# Patient Record
Sex: Female | Born: 1935 | Race: White | Hispanic: No | Smoking: Never smoker
Health system: Southern US, Community
[De-identification: ages and names within clinical notes are randomized; demographics above are authoritative.]

## PROBLEM LIST (undated history)

## (undated) DIAGNOSIS — K279 Peptic ulcer, site unspecified, unspecified as acute or chronic, without hemorrhage or perforation: Secondary | ICD-10-CM

## (undated) DIAGNOSIS — M1990 Unspecified osteoarthritis, unspecified site: Secondary | ICD-10-CM

## (undated) DIAGNOSIS — C50912 Malignant neoplasm of unspecified site of left female breast: Secondary | ICD-10-CM

## (undated) DIAGNOSIS — E785 Hyperlipidemia, unspecified: Secondary | ICD-10-CM

## (undated) DIAGNOSIS — I4891 Unspecified atrial fibrillation: Secondary | ICD-10-CM

## (undated) DIAGNOSIS — M6289 Other specified disorders of muscle: Secondary | ICD-10-CM

## (undated) DIAGNOSIS — Z9289 Personal history of other medical treatment: Secondary | ICD-10-CM

## (undated) DIAGNOSIS — D649 Anemia, unspecified: Secondary | ICD-10-CM

## (undated) DIAGNOSIS — I10 Essential (primary) hypertension: Secondary | ICD-10-CM

## (undated) DIAGNOSIS — N289 Disorder of kidney and ureter, unspecified: Secondary | ICD-10-CM

## (undated) DIAGNOSIS — R011 Cardiac murmur, unspecified: Secondary | ICD-10-CM

## (undated) DIAGNOSIS — C801 Malignant (primary) neoplasm, unspecified: Secondary | ICD-10-CM

## (undated) DIAGNOSIS — E039 Hypothyroidism, unspecified: Secondary | ICD-10-CM

## (undated) HISTORY — DX: Other specified disorders of muscle: M62.89

## (undated) HISTORY — DX: Hypothyroidism, unspecified: E03.9

## (undated) HISTORY — DX: Unspecified atrial fibrillation: I48.91

## (undated) HISTORY — PX: APPENDECTOMY: SHX54

## (undated) HISTORY — DX: Personal history of other medical treatment: Z92.89

## (undated) HISTORY — DX: Peptic ulcer, site unspecified, unspecified as acute or chronic, without hemorrhage or perforation: K27.9

## (undated) HISTORY — DX: Cardiac murmur, unspecified: R01.1

## (undated) HISTORY — DX: Unspecified osteoarthritis, unspecified site: M19.90

## (undated) HISTORY — DX: Disorder of kidney and ureter, unspecified: N28.9

## (undated) HISTORY — PX: BREAST SURGERY: SHX581

## (undated) HISTORY — DX: Malignant (primary) neoplasm, unspecified: C80.1

## (undated) HISTORY — DX: Hyperlipidemia, unspecified: E78.5

## (undated) HISTORY — DX: Essential (primary) hypertension: I10

## (undated) HISTORY — DX: Malignant neoplasm of unspecified site of left female breast: C50.912

## (undated) HISTORY — DX: Anemia, unspecified: D64.9

## (undated) HISTORY — PX: UPPER GASTROINTESTINAL ENDOSCOPY: SHX188

---

## 1999-10-06 ENCOUNTER — Encounter: Admission: RE | Admit: 1999-10-06 | Discharge: 1999-10-06 | Payer: Self-pay | Admitting: Family Medicine

## 1999-10-06 ENCOUNTER — Encounter: Payer: Self-pay | Admitting: Family Medicine

## 2000-10-17 ENCOUNTER — Encounter: Admission: RE | Admit: 2000-10-17 | Discharge: 2000-10-17 | Payer: Self-pay | Admitting: Family Medicine

## 2000-10-17 ENCOUNTER — Encounter: Payer: Self-pay | Admitting: Family Medicine

## 2002-09-13 ENCOUNTER — Encounter: Payer: Self-pay | Admitting: Family Medicine

## 2002-09-13 ENCOUNTER — Encounter: Admission: RE | Admit: 2002-09-13 | Discharge: 2002-09-13 | Payer: Self-pay | Admitting: Family Medicine

## 2002-11-25 ENCOUNTER — Other Ambulatory Visit: Admission: RE | Admit: 2002-11-25 | Discharge: 2002-11-25 | Payer: Self-pay | Admitting: Family Medicine

## 2004-04-13 ENCOUNTER — Encounter: Admission: RE | Admit: 2004-04-13 | Discharge: 2004-04-13 | Payer: Self-pay | Admitting: Family Medicine

## 2004-12-17 ENCOUNTER — Ambulatory Visit: Payer: Self-pay | Admitting: Family Medicine

## 2005-01-09 ENCOUNTER — Emergency Department (HOSPITAL_COMMUNITY): Admission: EM | Admit: 2005-01-09 | Discharge: 2005-01-09 | Payer: Self-pay | Admitting: Family Medicine

## 2005-01-13 ENCOUNTER — Emergency Department (HOSPITAL_COMMUNITY): Admission: EM | Admit: 2005-01-13 | Discharge: 2005-01-13 | Payer: Self-pay | Admitting: Emergency Medicine

## 2005-01-27 ENCOUNTER — Ambulatory Visit: Payer: Self-pay | Admitting: Family Medicine

## 2005-04-24 ENCOUNTER — Emergency Department (HOSPITAL_COMMUNITY): Admission: EM | Admit: 2005-04-24 | Discharge: 2005-04-24 | Payer: Self-pay | Admitting: Emergency Medicine

## 2005-06-27 ENCOUNTER — Encounter: Payer: Self-pay | Admitting: Cardiology

## 2005-06-27 ENCOUNTER — Ambulatory Visit: Payer: Self-pay

## 2005-07-04 ENCOUNTER — Encounter: Admission: RE | Admit: 2005-07-04 | Discharge: 2005-07-04 | Payer: Self-pay | Admitting: Family Medicine

## 2005-09-24 ENCOUNTER — Encounter: Payer: Self-pay | Admitting: Family Medicine

## 2006-08-26 ENCOUNTER — Encounter: Payer: Self-pay | Admitting: Family Medicine

## 2006-08-26 ENCOUNTER — Emergency Department (HOSPITAL_COMMUNITY): Admission: EM | Admit: 2006-08-26 | Discharge: 2006-08-26 | Payer: Self-pay | Admitting: Emergency Medicine

## 2006-08-29 ENCOUNTER — Ambulatory Visit: Payer: Self-pay | Admitting: *Deleted

## 2006-09-05 ENCOUNTER — Ambulatory Visit: Payer: Self-pay | Admitting: Internal Medicine

## 2006-09-07 ENCOUNTER — Ambulatory Visit: Payer: Self-pay | Admitting: *Deleted

## 2006-09-07 ENCOUNTER — Ambulatory Visit: Payer: Self-pay

## 2006-09-07 ENCOUNTER — Encounter: Payer: Self-pay | Admitting: Internal Medicine

## 2006-09-07 LAB — CONVERTED CEMR LAB
Cholesterol: 191 mg/dL (ref 0–200)
Total CHOL/HDL Ratio: 3.4
Triglycerides: 95 mg/dL (ref 0–149)

## 2006-09-20 ENCOUNTER — Ambulatory Visit: Payer: Self-pay | Admitting: *Deleted

## 2006-09-27 ENCOUNTER — Encounter: Payer: Self-pay | Admitting: Internal Medicine

## 2006-09-27 ENCOUNTER — Ambulatory Visit: Payer: Self-pay | Admitting: Internal Medicine

## 2006-10-05 ENCOUNTER — Ambulatory Visit: Payer: Self-pay | Admitting: Internal Medicine

## 2006-10-05 ENCOUNTER — Encounter: Payer: Self-pay | Admitting: Internal Medicine

## 2006-11-02 ENCOUNTER — Ambulatory Visit: Payer: Self-pay | Admitting: *Deleted

## 2006-11-02 LAB — CONVERTED CEMR LAB
ALT: 24 units/L (ref 0–40)
AST: 34 units/L (ref 0–37)
Albumin: 4.2 g/dL (ref 3.5–5.2)
Bilirubin, Direct: 0.1 mg/dL (ref 0.0–0.3)
Calcium: 10.1 mg/dL (ref 8.4–10.5)
Chloride: 104 meq/L (ref 96–112)
GFR calc Af Amer: 51 mL/min
GFR calc non Af Amer: 42 mL/min
Glucose, Bld: 133 mg/dL — ABNORMAL HIGH (ref 70–99)
LDL Cholesterol: 76 mg/dL (ref 0–99)
Sodium: 138 meq/L (ref 135–145)
Total CHOL/HDL Ratio: 2.6
VLDL: 25 mg/dL (ref 0–40)

## 2006-11-06 ENCOUNTER — Ambulatory Visit: Payer: Self-pay | Admitting: Internal Medicine

## 2006-11-10 ENCOUNTER — Ambulatory Visit: Payer: Self-pay | Admitting: Cardiovascular Disease

## 2006-11-16 ENCOUNTER — Ambulatory Visit: Payer: Self-pay | Admitting: Internal Medicine

## 2006-11-16 LAB — CONVERTED CEMR LAB: INR: 4.7 — ABNORMAL HIGH (ref 0.9–2.0)

## 2006-11-23 ENCOUNTER — Ambulatory Visit: Payer: Self-pay | Admitting: Internal Medicine

## 2006-12-04 ENCOUNTER — Ambulatory Visit: Payer: Self-pay | Admitting: Cardiovascular Disease

## 2006-12-07 ENCOUNTER — Ambulatory Visit: Payer: Self-pay

## 2006-12-14 ENCOUNTER — Ambulatory Visit: Payer: Self-pay | Admitting: Cardiology

## 2006-12-28 ENCOUNTER — Ambulatory Visit: Payer: Self-pay | Admitting: Cardiology

## 2007-01-01 ENCOUNTER — Ambulatory Visit: Payer: Self-pay | Admitting: Internal Medicine

## 2007-01-26 ENCOUNTER — Ambulatory Visit: Payer: Self-pay | Admitting: Internal Medicine

## 2007-02-26 ENCOUNTER — Ambulatory Visit: Payer: Self-pay | Admitting: Cardiology

## 2007-03-29 ENCOUNTER — Ambulatory Visit: Payer: Self-pay | Admitting: Internal Medicine

## 2007-04-30 ENCOUNTER — Ambulatory Visit: Payer: Self-pay | Admitting: Internal Medicine

## 2007-05-15 ENCOUNTER — Ambulatory Visit: Payer: Self-pay | Admitting: Internal Medicine

## 2007-06-05 ENCOUNTER — Ambulatory Visit: Payer: Self-pay | Admitting: Family Medicine

## 2007-06-05 DIAGNOSIS — K279 Peptic ulcer, site unspecified, unspecified as acute or chronic, without hemorrhage or perforation: Secondary | ICD-10-CM | POA: Insufficient documentation

## 2007-06-05 DIAGNOSIS — I10 Essential (primary) hypertension: Secondary | ICD-10-CM | POA: Insufficient documentation

## 2007-06-05 DIAGNOSIS — M109 Gout, unspecified: Secondary | ICD-10-CM | POA: Insufficient documentation

## 2007-06-05 DIAGNOSIS — E039 Hypothyroidism, unspecified: Secondary | ICD-10-CM | POA: Insufficient documentation

## 2007-06-05 DIAGNOSIS — I4891 Unspecified atrial fibrillation: Secondary | ICD-10-CM | POA: Insufficient documentation

## 2007-06-05 DIAGNOSIS — K219 Gastro-esophageal reflux disease without esophagitis: Secondary | ICD-10-CM

## 2007-06-05 DIAGNOSIS — E785 Hyperlipidemia, unspecified: Secondary | ICD-10-CM

## 2007-06-05 DIAGNOSIS — D509 Iron deficiency anemia, unspecified: Secondary | ICD-10-CM | POA: Insufficient documentation

## 2007-06-05 DIAGNOSIS — M1990 Unspecified osteoarthritis, unspecified site: Secondary | ICD-10-CM | POA: Insufficient documentation

## 2007-06-05 LAB — CONVERTED CEMR LAB
INR: 2.5
Prothrombin Time: 19.2 s

## 2007-06-11 LAB — CONVERTED CEMR LAB
ALT: 31 units/L (ref 0–35)
AST: 41 units/L — ABNORMAL HIGH (ref 0–37)
Albumin: 3.9 g/dL (ref 3.5–5.2)
Alkaline Phosphatase: 88 units/L (ref 39–117)
BUN: 20 mg/dL (ref 6–23)
CO2: 27 meq/L (ref 19–32)
Chloride: 101 meq/L (ref 96–112)
Creatinine, Ser: 1.1 mg/dL (ref 0.4–1.2)
Total Bilirubin: 0.9 mg/dL (ref 0.3–1.2)
Total Protein: 7.4 g/dL (ref 6.0–8.3)
Triglycerides: 118 mg/dL (ref 0–149)
VLDL: 24 mg/dL (ref 0–40)

## 2007-06-21 ENCOUNTER — Ambulatory Visit: Payer: Self-pay | Admitting: Family Medicine

## 2007-06-21 DIAGNOSIS — E119 Type 2 diabetes mellitus without complications: Secondary | ICD-10-CM | POA: Insufficient documentation

## 2007-06-27 ENCOUNTER — Telehealth: Payer: Self-pay | Admitting: Family Medicine

## 2007-06-28 ENCOUNTER — Telehealth (INDEPENDENT_AMBULATORY_CARE_PROVIDER_SITE_OTHER): Payer: Self-pay | Admitting: *Deleted

## 2007-07-05 ENCOUNTER — Ambulatory Visit: Payer: Self-pay | Admitting: Family Medicine

## 2007-07-05 ENCOUNTER — Encounter: Payer: Self-pay | Admitting: Family Medicine

## 2007-07-05 LAB — CONVERTED CEMR LAB
Blood Glucose, Fasting: 0 mg/dL
INR: 2.4
Prothrombin Time: 18.9 s

## 2007-07-09 ENCOUNTER — Telehealth: Payer: Self-pay | Admitting: Family Medicine

## 2007-08-01 ENCOUNTER — Ambulatory Visit: Payer: Self-pay | Admitting: Family Medicine

## 2007-08-02 LAB — CONVERTED CEMR LAB: INR: 1.9

## 2007-08-15 ENCOUNTER — Ambulatory Visit: Payer: Self-pay | Admitting: Family Medicine

## 2007-08-15 LAB — CONVERTED CEMR LAB

## 2007-08-28 ENCOUNTER — Encounter: Admission: RE | Admit: 2007-08-28 | Discharge: 2007-08-28 | Payer: Self-pay | Admitting: Family Medicine

## 2007-09-03 DIAGNOSIS — M810 Age-related osteoporosis without current pathological fracture: Secondary | ICD-10-CM

## 2007-09-04 ENCOUNTER — Ambulatory Visit: Payer: Self-pay | Admitting: Family Medicine

## 2007-09-04 LAB — CONVERTED CEMR LAB
HDL goal, serum: 40 mg/dL
LDL Goal: 100 mg/dL

## 2007-09-06 LAB — CONVERTED CEMR LAB
BUN: 24 mg/dL — ABNORMAL HIGH (ref 6–23)
CO2: 26 meq/L (ref 19–32)
Calcium: 9.5 mg/dL (ref 8.4–10.5)
Eosinophils Relative: 4.2 % (ref 0.0–5.0)
Ferritin: 21.7 ng/mL (ref 10.0–291.0)
Glucose, Bld: 130 mg/dL — ABNORMAL HIGH (ref 70–99)
Hemoglobin: 12.7 g/dL (ref 12.0–15.0)
Lymphocytes Relative: 26.5 % (ref 12.0–46.0)
Monocytes Relative: 7 % (ref 3.0–12.0)
Neutro Abs: 4.2 10*3/uL (ref 1.4–7.7)
Potassium: 3.6 meq/L (ref 3.5–5.1)
RBC: 4.13 M/uL (ref 3.87–5.11)
RDW: 14.3 % (ref 11.5–14.6)
Saturation Ratios: 19.6 % — ABNORMAL LOW (ref 20.0–50.0)
Sodium: 139 meq/L (ref 135–145)
Transferrin: 298.6 mg/dL (ref >=212.0)
WBC: 6.8 10*3/uL (ref 4.5–10.5)

## 2007-09-21 ENCOUNTER — Telehealth: Payer: Self-pay | Admitting: Family Medicine

## 2007-09-21 ENCOUNTER — Ambulatory Visit: Payer: Self-pay | Admitting: Family Medicine

## 2007-09-21 LAB — CONVERTED CEMR LAB
Glucose, Urine, Semiquant: NEGATIVE
Specific Gravity, Urine: 1.015
pH: 6

## 2007-10-02 ENCOUNTER — Ambulatory Visit: Payer: Self-pay | Admitting: Family Medicine

## 2007-10-16 ENCOUNTER — Ambulatory Visit: Payer: Self-pay | Admitting: Family Medicine

## 2007-10-18 LAB — CONVERTED CEMR LAB
INR: 1.6
Prothrombin Time: 15.5 s

## 2007-10-30 ENCOUNTER — Ambulatory Visit: Payer: Self-pay | Admitting: Family Medicine

## 2007-10-30 LAB — CONVERTED CEMR LAB: INR: 1.5

## 2007-11-12 ENCOUNTER — Ambulatory Visit: Payer: Self-pay | Admitting: Internal Medicine

## 2007-11-16 ENCOUNTER — Ambulatory Visit: Payer: Self-pay | Admitting: Family Medicine

## 2007-12-03 ENCOUNTER — Ambulatory Visit: Payer: Self-pay | Admitting: Family Medicine

## 2007-12-05 ENCOUNTER — Ambulatory Visit: Payer: Self-pay | Admitting: Family Medicine

## 2007-12-05 LAB — CONVERTED CEMR LAB
ALT: 34 units/L (ref 0–35)
AST: 59 units/L — ABNORMAL HIGH (ref 0–37)
BUN: 18 mg/dL (ref 6–23)
Chloride: 105 meq/L (ref 96–112)
Creatinine, Ser: 1.2 mg/dL (ref 0.4–1.2)
GFR calc Af Amer: 55 mL/min
GFR calc non Af Amer: 46 mL/min
LDL Cholesterol: 82 mg/dL (ref 0–99)
Potassium: 3.9 meq/L (ref 3.5–5.1)
Total Bilirubin: 0.4 mg/dL (ref 0.3–1.2)
Total CHOL/HDL Ratio: 3.2
Total Protein: 7.2 g/dL (ref 6.0–8.3)
Triglycerides: 111 mg/dL (ref 0–149)
VLDL: 22 mg/dL (ref 0–40)

## 2007-12-07 ENCOUNTER — Telehealth: Payer: Self-pay | Admitting: Family Medicine

## 2007-12-13 ENCOUNTER — Ambulatory Visit: Payer: Self-pay | Admitting: Family Medicine

## 2007-12-13 LAB — CONVERTED CEMR LAB: Prothrombin Time: 21 s

## 2008-01-10 ENCOUNTER — Ambulatory Visit: Payer: Self-pay | Admitting: Family Medicine

## 2008-02-07 ENCOUNTER — Ambulatory Visit: Payer: Self-pay | Admitting: Family Medicine

## 2008-02-07 LAB — CONVERTED CEMR LAB
INR: 2.7
Prothrombin Time: 19.9 s

## 2008-02-29 ENCOUNTER — Ambulatory Visit: Payer: Self-pay | Admitting: Family Medicine

## 2008-02-29 LAB — CONVERTED CEMR LAB
Hgb A1c MFr Bld: 5.9 % (ref 4.6–6.0)
INR: 3

## 2008-03-04 ENCOUNTER — Ambulatory Visit: Payer: Self-pay | Admitting: Family Medicine

## 2008-03-04 LAB — HM COLONOSCOPY: HM Colonoscopy: NORMAL

## 2008-03-28 ENCOUNTER — Ambulatory Visit: Payer: Self-pay | Admitting: Family Medicine

## 2008-03-31 ENCOUNTER — Ambulatory Visit: Payer: Self-pay | Admitting: Family Medicine

## 2008-04-29 ENCOUNTER — Ambulatory Visit: Payer: Self-pay | Admitting: Family Medicine

## 2008-05-28 ENCOUNTER — Ambulatory Visit: Payer: Self-pay | Admitting: Family Medicine

## 2008-06-13 ENCOUNTER — Telehealth: Payer: Self-pay | Admitting: Family Medicine

## 2008-06-26 ENCOUNTER — Ambulatory Visit: Payer: Self-pay | Admitting: Family Medicine

## 2008-06-26 LAB — CONVERTED CEMR LAB
INR: 2
Prothrombin Time: 17.6 s

## 2008-06-27 ENCOUNTER — Telehealth: Payer: Self-pay | Admitting: Family Medicine

## 2008-07-25 ENCOUNTER — Telehealth: Payer: Self-pay | Admitting: Family Medicine

## 2008-07-28 ENCOUNTER — Ambulatory Visit: Payer: Self-pay | Admitting: Family Medicine

## 2008-07-28 LAB — CONVERTED CEMR LAB: INR: 4

## 2008-08-11 ENCOUNTER — Ambulatory Visit: Payer: Self-pay | Admitting: Family Medicine

## 2008-08-11 LAB — CONVERTED CEMR LAB: Prothrombin Time: 20.6 s

## 2008-08-25 ENCOUNTER — Ambulatory Visit: Payer: Self-pay | Admitting: Family Medicine

## 2008-08-28 LAB — CONVERTED CEMR LAB
AST: 29 units/L (ref 0–37)
Alkaline Phosphatase: 67 units/L (ref 39–117)
BUN: 22 mg/dL (ref 6–23)
GFR calc non Af Amer: 45.59 mL/min (ref >60)
LDL Cholesterol: 67 mg/dL (ref 0–99)
Potassium: 3.8 meq/L (ref 3.5–5.1)
Sodium: 141 meq/L (ref 135–145)
TSH: 1.44 microintl units/mL (ref 0.35–5.50)
Total Bilirubin: 0.7 mg/dL (ref 0.3–1.2)
VLDL: 19 mg/dL (ref 0.0–40.0)

## 2008-09-09 ENCOUNTER — Ambulatory Visit: Payer: Self-pay | Admitting: Family Medicine

## 2008-09-10 LAB — CONVERTED CEMR LAB
Creatinine,U: 66.2 mg/dL
Microalb Creat Ratio: 1.5 mg/g (ref 0.0–30.0)

## 2008-09-25 ENCOUNTER — Ambulatory Visit: Payer: Self-pay | Admitting: Family Medicine

## 2008-10-21 ENCOUNTER — Encounter: Admission: RE | Admit: 2008-10-21 | Discharge: 2008-10-21 | Payer: Self-pay | Admitting: Family Medicine

## 2008-10-22 ENCOUNTER — Ambulatory Visit: Payer: Self-pay | Admitting: Family Medicine

## 2008-10-22 ENCOUNTER — Telehealth: Payer: Self-pay | Admitting: Family Medicine

## 2008-10-22 LAB — CONVERTED CEMR LAB
Glucose, Urine, Semiquant: NEGATIVE
Nitrite: NEGATIVE
Protein, U semiquant: 100
pH: 6

## 2008-10-29 ENCOUNTER — Encounter: Admission: RE | Admit: 2008-10-29 | Discharge: 2008-10-29 | Payer: Self-pay | Admitting: Family Medicine

## 2008-10-29 ENCOUNTER — Encounter (INDEPENDENT_AMBULATORY_CARE_PROVIDER_SITE_OTHER): Payer: Self-pay | Admitting: *Deleted

## 2008-11-24 ENCOUNTER — Ambulatory Visit: Payer: Self-pay | Admitting: Family Medicine

## 2008-11-24 LAB — CONVERTED CEMR LAB: INR: 1.5

## 2008-11-25 LAB — CONVERTED CEMR LAB: Hgb A1c MFr Bld: 5.8 % (ref 4.6–6.5)

## 2008-12-02 ENCOUNTER — Ambulatory Visit: Payer: Self-pay | Admitting: Family Medicine

## 2008-12-02 DIAGNOSIS — G4700 Insomnia, unspecified: Secondary | ICD-10-CM

## 2008-12-02 LAB — CONVERTED CEMR LAB
INR: 1.8
Prothrombin Time: 16.7 s

## 2008-12-03 LAB — CONVERTED CEMR LAB: Microalb, Ur: 0.2 mg/dL (ref 0.0–1.9)

## 2008-12-15 ENCOUNTER — Ambulatory Visit: Payer: Self-pay | Admitting: Family Medicine

## 2008-12-15 LAB — CONVERTED CEMR LAB
INR: 3.3
Prothrombin Time: 22 s

## 2008-12-29 ENCOUNTER — Ambulatory Visit: Payer: Self-pay | Admitting: Family Medicine

## 2009-01-12 ENCOUNTER — Ambulatory Visit: Payer: Self-pay | Admitting: Family Medicine

## 2009-01-12 LAB — CONVERTED CEMR LAB
INR: 3.3
Prothrombin Time: 21.8 s

## 2009-01-28 ENCOUNTER — Ambulatory Visit: Payer: Self-pay | Admitting: Family Medicine

## 2009-01-28 LAB — CONVERTED CEMR LAB: INR: 2.8

## 2009-02-26 ENCOUNTER — Ambulatory Visit: Payer: Self-pay | Admitting: Family Medicine

## 2009-03-26 ENCOUNTER — Ambulatory Visit: Payer: Self-pay | Admitting: Family Medicine

## 2009-03-26 LAB — CONVERTED CEMR LAB
INR: 1.1
Prothrombin Time: 13.2 s

## 2009-03-31 ENCOUNTER — Ambulatory Visit: Payer: Self-pay | Admitting: Family Medicine

## 2009-04-15 ENCOUNTER — Ambulatory Visit: Payer: Self-pay | Admitting: Family Medicine

## 2009-04-15 LAB — CONVERTED CEMR LAB: Prothrombin Time: 19 s

## 2009-05-13 ENCOUNTER — Ambulatory Visit: Payer: Self-pay | Admitting: Family Medicine

## 2009-05-13 LAB — CONVERTED CEMR LAB: Prothrombin Time: 18.7 s

## 2009-06-17 ENCOUNTER — Ambulatory Visit: Payer: Self-pay | Admitting: Family Medicine

## 2009-06-17 LAB — CONVERTED CEMR LAB: Prothrombin Time: 17.6 s

## 2009-06-18 LAB — CONVERTED CEMR LAB
ALT: 14 units/L (ref 0–35)
AST: 27 units/L (ref 0–37)
Albumin: 4 g/dL (ref 3.5–5.2)
BUN: 24 mg/dL — ABNORMAL HIGH (ref 6–23)
CO2: 27 meq/L (ref 19–32)
Chloride: 108 meq/L (ref 96–112)
Cholesterol: 143 mg/dL (ref 0–200)
Glucose, Bld: 110 mg/dL — ABNORMAL HIGH (ref 70–99)
Hgb A1c MFr Bld: 6 % (ref 4.6–6.5)
Potassium: 3.7 meq/L (ref 3.5–5.1)

## 2009-07-16 ENCOUNTER — Ambulatory Visit: Payer: Self-pay | Admitting: Family Medicine

## 2009-07-30 ENCOUNTER — Ambulatory Visit: Payer: Self-pay | Admitting: Family Medicine

## 2009-08-12 ENCOUNTER — Ambulatory Visit: Payer: Self-pay | Admitting: Family Medicine

## 2009-09-09 ENCOUNTER — Ambulatory Visit: Payer: Self-pay | Admitting: Family Medicine

## 2009-10-08 ENCOUNTER — Ambulatory Visit: Payer: Self-pay | Admitting: Family Medicine

## 2009-10-08 LAB — CONVERTED CEMR LAB: INR: 2.6

## 2009-10-29 ENCOUNTER — Ambulatory Visit: Payer: Self-pay | Admitting: Family Medicine

## 2009-10-29 LAB — CONVERTED CEMR LAB
AST: 31 units/L (ref 0–37)
Alkaline Phosphatase: 81 units/L (ref 39–117)
BUN: 28 mg/dL — ABNORMAL HIGH (ref 6–23)
Calcium: 9.5 mg/dL (ref 8.4–10.5)
GFR calc non Af Amer: 37.74 mL/min (ref >60)
HDL: 58.5 mg/dL (ref >39.00)
Hgb A1c MFr Bld: 6.2 % (ref 4.6–6.5)
INR: 2.3
Potassium: 3.9 meq/L (ref 3.5–5.1)
Sodium: 140 meq/L (ref 135–145)
TSH: 1.83 microintl units/mL (ref 0.35–5.50)
Total Bilirubin: 0.7 mg/dL (ref 0.3–1.2)
Triglycerides: 94 mg/dL (ref 0.0–149.0)
VLDL: 18.8 mg/dL (ref 0.0–40.0)

## 2009-11-04 ENCOUNTER — Ambulatory Visit: Payer: Self-pay | Admitting: Family Medicine

## 2009-11-10 ENCOUNTER — Telehealth: Payer: Self-pay | Admitting: Family Medicine

## 2009-11-26 ENCOUNTER — Ambulatory Visit: Payer: Self-pay | Admitting: Family Medicine

## 2009-12-22 ENCOUNTER — Ambulatory Visit: Payer: Self-pay | Admitting: Family Medicine

## 2009-12-22 LAB — CONVERTED CEMR LAB: Prothrombin Time: 43.2 s

## 2009-12-28 ENCOUNTER — Telehealth: Payer: Self-pay | Admitting: Family Medicine

## 2009-12-29 ENCOUNTER — Encounter: Admission: RE | Admit: 2009-12-29 | Discharge: 2009-12-29 | Payer: Self-pay | Admitting: Family Medicine

## 2009-12-30 DIAGNOSIS — C50919 Malignant neoplasm of unspecified site of unspecified female breast: Secondary | ICD-10-CM | POA: Insufficient documentation

## 2010-01-05 ENCOUNTER — Ambulatory Visit: Payer: Self-pay | Admitting: Family Medicine

## 2010-01-05 LAB — CONVERTED CEMR LAB: INR: 3.1

## 2010-01-06 ENCOUNTER — Telehealth: Payer: Self-pay | Admitting: Family Medicine

## 2010-01-06 ENCOUNTER — Encounter: Admission: RE | Admit: 2010-01-06 | Discharge: 2010-01-06 | Payer: Self-pay | Admitting: Family Medicine

## 2010-01-08 ENCOUNTER — Encounter: Admission: RE | Admit: 2010-01-08 | Discharge: 2010-01-08 | Payer: Self-pay | Admitting: Family Medicine

## 2010-01-08 LAB — HM MAMMOGRAPHY

## 2010-01-14 ENCOUNTER — Encounter: Payer: Self-pay | Admitting: Family Medicine

## 2010-01-19 ENCOUNTER — Ambulatory Visit: Payer: Self-pay | Admitting: Family Medicine

## 2010-01-19 LAB — CONVERTED CEMR LAB
INR: 2
Prothrombin Time: 23.9 s

## 2010-01-20 ENCOUNTER — Ambulatory Visit: Payer: Self-pay | Admitting: Oncology

## 2010-01-26 ENCOUNTER — Encounter: Payer: Self-pay | Admitting: Family Medicine

## 2010-01-29 ENCOUNTER — Encounter: Payer: Self-pay | Admitting: Family Medicine

## 2010-02-08 ENCOUNTER — Ambulatory Visit: Payer: Self-pay | Admitting: Internal Medicine

## 2010-02-16 ENCOUNTER — Ambulatory Visit: Payer: Self-pay | Admitting: Family Medicine

## 2010-02-17 ENCOUNTER — Encounter: Payer: Self-pay | Admitting: Family Medicine

## 2010-02-17 ENCOUNTER — Ambulatory Visit: Payer: Self-pay | Admitting: Family Medicine

## 2010-02-18 LAB — CONVERTED CEMR LAB
BUN: 20 mg/dL (ref 6–23)
Basophils Absolute: 0.1 10*3/uL (ref 0.0–0.1)
CO2: 26 meq/L (ref 19–32)
Calcium: 9.8 mg/dL (ref 8.4–10.5)
Creatinine, Ser: 1.2 mg/dL (ref 0.4–1.2)
Eosinophils Absolute: 0.2 10*3/uL (ref 0.0–0.7)
GFR calc non Af Amer: 45.86 mL/min (ref >60)
Glucose, Bld: 106 mg/dL — ABNORMAL HIGH (ref 70–99)
Lymphocytes Relative: 25.5 % (ref 12.0–46.0)
MCHC: 33.9 g/dL (ref 30.0–36.0)
Monocytes Relative: 6.8 % (ref 3.0–12.0)
Neutrophils Relative %: 65.2 % (ref 43.0–77.0)
Platelets: 280 10*3/uL (ref 150.0–400.0)
RDW: 16 % — ABNORMAL HIGH (ref 11.5–14.6)
Sodium: 134 meq/L — ABNORMAL LOW (ref 135–145)

## 2010-02-22 ENCOUNTER — Ambulatory Visit: Payer: Self-pay | Admitting: Oncology

## 2010-02-22 ENCOUNTER — Telehealth: Payer: Self-pay | Admitting: Family Medicine

## 2010-02-27 HISTORY — PX: BREAST LUMPECTOMY: SHX2

## 2010-03-02 ENCOUNTER — Encounter: Payer: Self-pay | Admitting: Family Medicine

## 2010-03-26 ENCOUNTER — Ambulatory Visit (HOSPITAL_COMMUNITY): Admission: RE | Admit: 2010-03-26 | Discharge: 2010-03-26 | Payer: Self-pay | Admitting: General Surgery

## 2010-03-26 ENCOUNTER — Encounter: Admission: RE | Admit: 2010-03-26 | Discharge: 2010-03-26 | Payer: Self-pay | Admitting: General Surgery

## 2010-04-02 ENCOUNTER — Ambulatory Visit: Payer: Self-pay | Admitting: Family Medicine

## 2010-04-02 LAB — CONVERTED CEMR LAB: Prothrombin Time: 19.7 s

## 2010-04-05 ENCOUNTER — Encounter: Payer: Self-pay | Admitting: Family Medicine

## 2010-04-07 ENCOUNTER — Other Ambulatory Visit: Payer: Self-pay | Admitting: Oncology

## 2010-04-07 LAB — CBC WITH DIFFERENTIAL/PLATELET
BASO%: 0.4 % (ref 0.0–2.0)
Basophils Absolute: 0 10e3/uL (ref 0.0–0.1)
EOS%: 3.5 % (ref 0.0–7.0)
Eosinophils Absolute: 0.3 10e3/uL (ref 0.0–0.5)
HCT: 37.6 % (ref 34.8–46.6)
HGB: 12.6 g/dL (ref 11.6–15.9)
LYMPH%: 30 % (ref 14.0–49.7)
MCH: 31.2 pg (ref 25.1–34.0)
MCHC: 33.3 g/dL (ref 31.5–36.0)
MCV: 93.6 fL (ref 79.5–101.0)
MONO#: 0.9 10e3/uL (ref 0.1–0.9)
MONO%: 8.7 % (ref 0.0–14.0)
NEUT#: 5.7 10e3/uL (ref 1.5–6.5)
NEUT%: 57.4 % (ref 38.4–76.8)
Platelets: 289 10e3/uL (ref 145–400)
RBC: 4.02 10e6/uL (ref 3.70–5.45)
RDW: 16.3 % — ABNORMAL HIGH (ref 11.2–14.5)
WBC: 9.9 10e3/uL (ref 3.9–10.3)
lymph#: 3 10e3/uL (ref 0.9–3.3)

## 2010-04-07 LAB — COMPREHENSIVE METABOLIC PANEL
ALT: 18 U/L (ref 0–35)
Albumin: 4 g/dL (ref 3.5–5.2)
CO2: 23 mEq/L (ref 19–32)
Calcium: 9.3 mg/dL (ref 8.4–10.5)
Chloride: 103 mEq/L (ref 96–112)
Glucose, Bld: 110 mg/dL — ABNORMAL HIGH (ref 70–99)
Sodium: 139 mEq/L (ref 135–145)
Total Protein: 7.4 g/dL (ref 6.0–8.3)

## 2010-04-08 LAB — VITAMIN D 25 HYDROXY (VIT D DEFICIENCY, FRACTURES): Vit D, 25-Hydroxy: 57 ng/mL (ref 30–89)

## 2010-04-14 ENCOUNTER — Ambulatory Visit: Payer: Self-pay | Admitting: Family Medicine

## 2010-04-14 ENCOUNTER — Telehealth: Payer: Self-pay | Admitting: Family Medicine

## 2010-04-14 LAB — CONVERTED CEMR LAB
INR: 2
Prothrombin Time: 24.1 s

## 2010-05-06 ENCOUNTER — Ambulatory Visit: Payer: Self-pay | Admitting: Family Medicine

## 2010-05-10 ENCOUNTER — Encounter: Payer: Self-pay | Admitting: Family Medicine

## 2010-06-03 ENCOUNTER — Other Ambulatory Visit: Payer: Self-pay | Admitting: Family Medicine

## 2010-06-03 ENCOUNTER — Ambulatory Visit
Admission: RE | Admit: 2010-06-03 | Discharge: 2010-06-03 | Payer: Self-pay | Source: Home / Self Care | Attending: Family Medicine | Admitting: Family Medicine

## 2010-06-03 LAB — HEPATIC FUNCTION PANEL
ALT: 16 U/L (ref 0–35)
AST: 28 U/L (ref 0–37)
Albumin: 3.7 g/dL (ref 3.5–5.2)
Alkaline Phosphatase: 73 U/L (ref 39–117)
Bilirubin, Direct: 0.1 mg/dL (ref 0.0–0.3)
Total Bilirubin: 0.2 mg/dL — ABNORMAL LOW (ref 0.3–1.2)
Total Protein: 6.6 g/dL (ref 6.0–8.3)

## 2010-06-03 LAB — CBC WITH DIFFERENTIAL/PLATELET
Basophils Absolute: 0 10*3/uL (ref 0.0–0.1)
Basophils Relative: 0.6 % (ref 0.0–3.0)
Eosinophils Absolute: 0.2 10*3/uL (ref 0.0–0.7)
Eosinophils Relative: 3.7 % (ref 0.0–5.0)
HCT: 36.6 % (ref 36.0–46.0)
Hemoglobin: 12.2 g/dL (ref 12.0–15.0)
Lymphocytes Relative: 28.5 % (ref 12.0–46.0)
Lymphs Abs: 1.9 10*3/uL (ref 0.7–4.0)
MCHC: 33.4 g/dL (ref 30.0–36.0)
MCV: 92.4 fl (ref 78.0–100.0)
Monocytes Absolute: 0.5 10*3/uL (ref 0.1–1.0)
Monocytes Relative: 8.2 % (ref 3.0–12.0)
Neutro Abs: 3.9 10*3/uL (ref 1.4–7.7)
Neutrophils Relative %: 59 % (ref 43.0–77.0)
Platelets: 274 10*3/uL (ref 150.0–400.0)
RBC: 3.96 Mil/uL (ref 3.87–5.11)
RDW: 15.4 % — ABNORMAL HIGH (ref 11.5–14.6)
WBC: 6.6 10*3/uL (ref 4.5–10.5)

## 2010-06-03 LAB — HEMOGLOBIN A1C: Hgb A1c MFr Bld: 6.2 % (ref 4.6–6.5)

## 2010-06-03 LAB — LIPID PANEL
Cholesterol: 149 mg/dL (ref 0–200)
HDL: 51.8 mg/dL (ref >39.00)
LDL Cholesterol: 73 mg/dL (ref 0–99)
Total CHOL/HDL Ratio: 3
Triglycerides: 120 mg/dL (ref 0.0–149.0)
VLDL: 24 mg/dL (ref 0.0–40.0)

## 2010-06-03 LAB — MICROALBUMIN / CREATININE URINE RATIO
Creatinine,U: 112.8 mg/dL
Microalb Creat Ratio: 1.5 mg/g (ref 0.0–30.0)
Microalb, Ur: 1.7 mg/dL (ref 0.0–1.9)

## 2010-06-10 ENCOUNTER — Ambulatory Visit
Admission: RE | Admit: 2010-06-10 | Discharge: 2010-06-10 | Payer: Self-pay | Source: Home / Self Care | Attending: Family Medicine | Admitting: Family Medicine

## 2010-07-01 ENCOUNTER — Ambulatory Visit (INDEPENDENT_AMBULATORY_CARE_PROVIDER_SITE_OTHER): Payer: MEDICARE

## 2010-07-01 ENCOUNTER — Other Ambulatory Visit: Payer: Self-pay

## 2010-07-01 ENCOUNTER — Ambulatory Visit: Admit: 2010-07-01 | Payer: Self-pay | Admitting: Family Medicine

## 2010-07-01 ENCOUNTER — Encounter: Payer: Self-pay | Admitting: Family Medicine

## 2010-07-01 DIAGNOSIS — I4891 Unspecified atrial fibrillation: Secondary | ICD-10-CM

## 2010-07-01 DIAGNOSIS — Z5181 Encounter for therapeutic drug level monitoring: Secondary | ICD-10-CM

## 2010-07-01 DIAGNOSIS — Z7901 Long term (current) use of anticoagulants: Secondary | ICD-10-CM

## 2010-07-01 LAB — CONVERTED CEMR LAB: INR: 2

## 2010-07-01 NOTE — Assessment & Plan Note (Signed)
Summary: dizzy/alc   Vital Signs:  Patient profile:   77 year old female Height:      61.5 inches Weight:      122.0 pounds BMI:     22.76 Temp:     97.8 degrees F oral Pulse rate:   80 / minute Pulse (ortho):   72 / minute Pulse rhythm:   regular BP supine:   120 / 80  (right arm) BP sitting:   130 / 80  (right arm) BP standing:   110 / 70  (right arm) Cuff size:   regular  Vitals Entered By: Benny Lennert CMA Duncan Dull) (February 17, 2010 12:22 PM)  History of Present Illness: Chief complaint ? vertigo  77 year old female:  Started to get swimmy headed six days ago not sure if rotational motion around head like vertigo lightheaded with motion no palpitations does not feel like in a fib or out of rhythm  no preceding illness or problem at all.   Breast cancer, but not nercous.   Allergies (verified): No Known Drug Allergies  Past History:  Past medical, surgical, family and social histories (including risk factors) reviewed, and no changes noted (except as noted below).  Past Medical History: Reviewed history from 06/05/2007 and no changes required. Osteoarthritis GERD Hypertension Gout Peptic ulcer disease Anemia-iron deficiency Hyperlipidemia  Past Surgical History: Reviewed history from 06/05/2007 and no changes required. 2006 L  knee fracture endoscopy 4/08: PUD 4 C-sections Appendectomy  Family History: Reviewed history from 06/05/2007 and no changes required. father; ? mother: DM sister: DM, breast cancer ? grandparents health history  Social History: Reviewed history from 06/05/2007 and no changes required. Occupation: Worked at a [employer] widowed Never Smoked Alcohol use-no Drug use-no Regular exercise-yes, walking 2 times per day 0.5 mile Diet: fruits and veggies, water  Review of Systems  The patient denies vision loss, chest pain, syncope, difficulty walking, and abnormal bleeding.   CV:  Complains of lightheadness and near  fainting.  Physical Exam  General:  Well-developed,well-nourished,in no acute distress; alert,appropriate and cooperative throughout examination Head:  Normocephalic and atraumatic without obvious abnormalities. No apparent alopecia or balding. Ears:  no external deformities.   Nose:  no external deformity.   Lungs:  Normal respiratory effort, chest expands symmetrically. Lungs are clear to auscultation, no crackles or wheezes. Heart:  Normal rate and regular rhythm. S1 and S2 normal without gallop, murmur, click, rub or other extra sounds. Extremities:  No clubbing, cyanosis, edema, or deformity noted with normal full range of motion of all joints.   Neurologic:  alert & oriented X3.   Cervical Nodes:  No lymphadenopathy noted Psych:  Cognition and judgment appear intact. Alert and cooperative with normal attention span and concentration. No apparent delusions, illusions, hallucinations   Impression & Recommendations:  Problem # 1:  DIZZINESS (ICD-780.4) Assessment New i think mostly orthostasis, positional changes making it worse, some drop in bp with motion hydrate well may be a component of vertigo - i am going to have her take a 1/2 a meclizine to see if this helps  check basic labs, r/u electrolyte disturbance and anemia  Her updated medication list for this problem includes:    Meclizine Hcl 25 Mg Tabs (Meclizine hcl) .Marland Kitchen... 1/2 to 1 by mouth two times a day as needed vertigo  Orders: Venipuncture (45409) TLB-BMP (Basic Metabolic Panel-BMET) (80048-METABOL) TLB-CBC Platelet - w/Differential (85025-CBCD)  Complete Medication List: 1)  Allopurinol 300 Mg Tabs (Allopurinol) .... Take 1 tablet by  mouth once a day 2)  Synthroid 50 Mcg Tabs (Levothyroxine sodium) .... Take 1 tablet by mouth once a day 3)  Metoprolol Succinate 25 Mg Tb24 (Metoprolol succinate) .... Take 1 tablet by mouth once a day 4)  Lisinopril-hydrochlorothiazide 20-12.5 Mg Tabs  (Lisinopril-hydrochlorothiazide) .... Take one by mouth daily 5)  Simvastatin 20 Mg Tabs (Simvastatin) .... Take 1 tablet by mouth once a day 6)  Warfarin Sodium 5 Mg Tabs (Warfarin sodium) .... As directed 7)  Omeprazole 20 Mg Cpdr (Omeprazole) .... Take 1 tablet by mouth once a day 8)  Eye-vite Extra Caps (Multiple vitamins-minerals) .... Take 1 capsule by mouth once a day 9)  Zolpidem Tartrate 5 Mg Tabs (Zolpidem tartrate) .... Take 1/2 tab by mouth at bedtime as needed 10)  Meclizine Hcl 25 Mg Tabs (Meclizine hcl) .... 1/2 to 1 by mouth two times a day as needed vertigo  Patient Instructions: 1)  Oral rehydration solution: Drink 1/2 ounce every 15 minutes. If tolerated after 1 hour, drink 1 ounce every 15 minutes. As you  can tolerate, keep adding 1/2 ounce every 15 minutes, up to a total or 2-4 ounces.   Prescriptions: MECLIZINE HCL 25 MG TABS (MECLIZINE HCL) 1/2 to 1 by mouth two times a day as needed vertigo  #30 x 0   Entered and Authorized by:   [name] MD   Signed by:   [name] MD on 02/17/2010   Method used:   Electronically to        [facility] (retail)       [address]       [place], [place]  [address]       Ph: [phone]       Fax: [phone]   RxID:   [id number]   Current Allergies (reviewed today): No known allergies   Appended Document: dizzy/alc EKG: RRR. there is some artifact, but o/w agree with machine. [name] MD  February 17, 2010 2:49 PM       Clinical Lists Changes  Orders: Added new Service order of EKG w/ Interpretation (93000) - Signed

## 2010-07-01 NOTE — Progress Notes (Signed)
Summary: pt wants to continue lisinopril  Phone Note Call from Patient   Caller: Patient Call For: Kerby Nora MD Summary of Call: Pt was to change from lisinopril to losartan because of her cough, but she says her cough is completly gone and she wants to continue to take lisinopril.  Changed back to lisinopril in emr. Initial call taken by: Lowella Petties CMA,  November 10, 2009 10:40 AM  Follow-up for Phone Call        Noted. Follow-up by: Kerby Nora MD,  November 10, 2009 10:46 AM    New/Updated Medications: LISINOPRIL-HYDROCHLOROTHIAZIDE 20-12.5 MG TABS (LISINOPRIL-HYDROCHLOROTHIAZIDE) take one by mouth daily  Prior Medications: ALLOPURINOL 300 MG  TABS (ALLOPURINOL) Take 1 tablet by mouth once a day SYNTHROID 50 MCG  TABS (LEVOTHYROXINE SODIUM) Take 1 tablet by mouth once a day METOPROLOL SUCCINATE 25 MG  TB24 (METOPROLOL SUCCINATE) Take 1 tablet by mouth once a day LISINOPRIL-HYDROCHLOROTHIAZIDE 20-12.5 MG TABS (LISINOPRIL-HYDROCHLOROTHIAZIDE) take one by mouth daily SIMVASTATIN 20 MG  TABS (SIMVASTATIN) Take 1 tablet by mouth once a day DILTIAZEM HCL CR 180 MG  CP24 (DILTIAZEM HCL) Take 1 tablet by mouth once a day WARFARIN SODIUM 5 MG  TABS (WARFARIN SODIUM) as directed OMEPRAZOLE 20 MG  CPDR (OMEPRAZOLE) Take 1 tablet by mouth once a day EYE-VITE EXTRA   CAPS (MULTIPLE VITAMINS-MINERALS) Take 1 capsule by mouth once a day ZOLPIDEM TARTRATE 5 MG  TABS (ZOLPIDEM TARTRATE) Take 1/2 tab by mouth at bedtime as needed Current Allergies: No known allergies

## 2010-07-01 NOTE — Assessment & Plan Note (Signed)
Summary: F/U DLO   Vital Signs:  Patient profile:   77 year old female Height:      61.5 inches Weight:      120.6 pounds BMI:     22.50 Temp:     97.8 degrees F oral Pulse rate:   72 / minute Pulse rhythm:   regular BP sitting:   130 / 80  (left arm) Cuff size:   regular  Vitals Entered By: Benny Lennert CMA Duncan Dull) (November 04, 2009 9:15 AM)  History of Present Illness: Chief complaint follow up  Doing well overall.  DM,well controlled.   HTN, well controlled on cyurrent meds.   Insomnia... well controlled off ambien. Limiting use.  Mood doing well overall. No recent falls. Memory doing well, per pt.     Current Medications (verified): 1)  Allopurinol 300 Mg  Tabs (Allopurinol) .... Take 1 Tablet By Mouth Once A Day 2)  Synthroid 50 Mcg  Tabs (Levothyroxine Sodium) .... Take 1 Tablet By Mouth Once A Day 3)  Metoprolol Succinate 25 Mg  Tb24 (Metoprolol Succinate) .... Take 1 Tablet By Mouth Once A Day 4)  Losartan Potassium-Hctz 50-12.5 Mg Tabs (Losartan Potassium-Hctz) .Marland Kitchen.. 1 Tab By Mouth Daily 5)  Simvastatin 20 Mg  Tabs (Simvastatin) .... Take 1 Tablet By Mouth Once A Day 6)  Diltiazem Hcl Cr 180 Mg  Cp24 (Diltiazem Hcl) .... Take 1 Tablet By Mouth Once A Day 7)  Warfarin Sodium 5 Mg  Tabs (Warfarin Sodium) .... As Directed 8)  Omeprazole 20 Mg  Cpdr (Omeprazole) .... Take 1 Tablet By Mouth Once A Day 9)  Eye-Vite Extra   Caps (Multiple Vitamins-Minerals) .... Take 1 Capsule By Mouth Once A Day 10)  Zolpidem Tartrate 5 Mg  Tabs (Zolpidem Tartrate) .... Take 1/2 Tab By Mouth At Bedtime As Needed  Allergies (verified): No Known Drug Allergies  Review of Systems General:  Denies fatigue and fever. CV:  Denies chest pain or discomfort. Resp:  Denies coughing up blood and shortness of breath; Occ tickle in throat when gets hot. GI:  Denies abdominal pain, bloody stools, constipation, and diarrhea. GU:  Denies dysuria.  Physical Exam  General:  Elderly female in  NAD Ears:  External ear exam shows no significant lesions or deformities.  Otoscopic examination reveals clear canals, tympanic membranes are intact bilaterally without bulging, retraction, inflammation or discharge. Hearing is grossly normal bilaterally. Nose:  nasal dischargemucosal pallor.   Mouth:  Oral mucosa and oropharynx without lesions or exudates.  Teeth in good repair. Neck:  no carotid bruit or thyromegaly no cervical or supraclavicular lymphadenopathy  Lungs:  Normal respiratory effort, chest expands symmetrically. Lungs are clear to auscultation, no crackles or wheezes. Heart:  Normal rate and regular rhythm. S1 and S2 normal without gallop, murmur, click, rub or other extra sounds. Abdomen:  Bowel sounds positive,abdomen soft and non-tender without masses, organomegaly or hernias noted. Pulses:  R and L posterior tibial pulses are full and equal bilaterally  Extremities:  no edema  Skin:  Intact without suspicious lesions or rashes Psych:  Cognition and judgment appear intact. Alert and cooperative with normal attention span and concentration. No apparent delusions, illusions, hallucinations  Diabetes Management Exam:    Foot Exam (with socks and/or shoes not present):       Sensory-Pinprick/Light touch:          Left medial foot (L-4): normal          Left dorsal foot (L-5): normal  Left lateral foot (S-1): normal          Right medial foot (L-4): normal          Right dorsal foot (L-5): normal          Right lateral foot (S-1): normal       Sensory-Monofilament:          Left foot: normal          Right foot: normal       Inspection:          Left foot: normal          Right foot: normal       Nails:          Left foot: normal          Right foot: normal   Impression & Recommendations:  Problem # 1:  ATRIAL FIBRILLATION (ICD-427.31) Stable, rate controlled. Continue coumadin.  Her updated medication list for this problem includes:    Metoprolol Succinate  25 Mg Tb24 (Metoprolol succinate) .Marland Kitchen... Take 1 tablet by mouth once a day    Diltiazem Hcl Cr 180 Mg Cp24 (Diltiazem hcl) .Marland Kitchen... Take 1 tablet by mouth once a day    Warfarin Sodium 5 Mg Tabs (Warfarin sodium) .Marland Kitchen... As directed  Problem # 2:  AODM (ICD-250.00)  Well controlled. Continue current medication.  Her updated medication list for this problem includes:    Losartan Potassium-hctz 50-12.5 Mg Tabs (Losartan potassium-hctz) .Marland Kitchen... 1 tab by mouth daily  Labs Reviewed: Creat: 1.4 (10/29/2009)     Last Eye Exam: macular deg (06/16/2009) Reviewed HgBA1c results: 6.2 (10/29/2009)  6.0 (06/17/2009)  Problem # 3:  HYPOTHYROIDISM (ICD-244.9)  Well controlled. Continue current medication.  Her updated medication list for this problem includes:    Synthroid 50 Mcg Tabs (Levothyroxine sodium) .Marland Kitchen... Take 1 tablet by mouth once a day  Labs Reviewed: TSH: 1.83 (10/29/2009)    HgBA1c: 6.2 (10/29/2009) Chol: 160 (10/29/2009)   HDL: 58.50 (10/29/2009)   LDL: 83 (10/29/2009)   TG: 94.0 (10/29/2009)  Problem # 4:  HYPERLIPIDEMIA (ICD-272.4) Well controlled. Continue current medication.  Her updated medication list for this problem includes:    Simvastatin 20 Mg Tabs (Simvastatin) .Marland Kitchen... Take 1 tablet by mouth once a day  Labs Reviewed: SGOT: 31 (10/29/2009)   SGPT: 18 (10/29/2009)  Lipid Goals: Chol Goal: 200 (09/04/2007)   HDL Goal: 40 (09/04/2007)   LDL Goal: 100 (09/04/2007)   TG Goal: 150 (09/04/2007)  Prior 10 Yr Risk Heart Disease: 9 % (06/17/2009)   HDL:58.50 (10/29/2009), 64.50 (06/17/2009)  LDL:83 (10/29/2009), 54 (06/17/2009)  Chol:160 (10/29/2009), 143 (06/17/2009)  Trig:94.0 (10/29/2009), 124.0 (06/17/2009)  Problem # 5:  HYPERTENSION (ICD-401.9) Well controlled. Continue current medication.  Her updated medication list for this problem includes:    Metoprolol Succinate 25 Mg Tb24 (Metoprolol succinate) .Marland Kitchen... Take 1 tablet by mouth once a day    Losartan Potassium-hctz  50-12.5 Mg Tabs (Losartan potassium-hctz) .Marland Kitchen... 1 tab by mouth daily    Diltiazem Hcl Cr 180 Mg Cp24 (Diltiazem hcl) .Marland Kitchen... Take 1 tablet by mouth once a day  Complete Medication List: 1)  Allopurinol 300 Mg Tabs (Allopurinol) .... Take 1 tablet by mouth once a day 2)  Synthroid 50 Mcg Tabs (Levothyroxine sodium) .... Take 1 tablet by mouth once a day 3)  Metoprolol Succinate 25 Mg Tb24 (Metoprolol succinate) .... Take 1 tablet by mouth once a day 4)  Losartan Potassium-hctz 50-12.5 Mg Tabs (Losartan potassium-hctz) .Marland KitchenMarland KitchenMarland Kitchen 1  tab by mouth daily 5)  Simvastatin 20 Mg Tabs (Simvastatin) .... Take 1 tablet by mouth once a day 6)  Diltiazem Hcl Cr 180 Mg Cp24 (Diltiazem hcl) .... Take 1 tablet by mouth once a day 7)  Warfarin Sodium 5 Mg Tabs (Warfarin sodium) .... As directed 8)  Omeprazole 20 Mg Cpdr (Omeprazole) .... Take 1 tablet by mouth once a day 9)  Eye-vite Extra Caps (Multiple vitamins-minerals) .... Take 1 capsule by mouth once a day 10)  Zolpidem Tartrate 5 Mg Tabs (Zolpidem tartrate) .... Take 1/2 tab by mouth at bedtime as needed  Patient Instructions: 1)  Stop lisniopril HCTZ and change to losartan HCTZ daily to see if cough improves.  2)  Can try Zyrtec for allergies at bedtime. 3)  Please schedule a follow-up appointment in 6 months .  4)  BMP prior to visit, ICD-9: 250.00 5)  Hepatic Panel prior to visit ICD-9:  6)  Lipid panel prior to visit ICD-9 :  7)  HgBA1c prior to visit  ICD-9:  8)  Urine Microalbumin prior to visit ICD-9 :  Prescriptions: LOSARTAN POTASSIUM-HCTZ 50-12.5 MG TABS (LOSARTAN POTASSIUM-HCTZ) 1 tab by mouth daily  #30 x 11   Entered and Authorized by:   Kerby Nora MD   Signed by:   Kerby Nora MD on 11/04/2009   Method used:   Electronically to        [facility] (retail)       [address]       [place], [place]  [address]       Ph: [phone]       Fax: [phone]   RxID:   [id number]   Current Allergies  (reviewed today): No known allergies

## 2010-07-01 NOTE — Letter (Signed)
Summary: [facility]  [facility]   Imported By: Lanelle Bal 02/10/2010 09:10:17  _____________________________________________________________________  External Attachment:    Type:   Image     Comment:   External Document

## 2010-07-01 NOTE — Letter (Signed)
Summary: [facility]   Imported By: Lanelle Bal 02/10/2010 08:22:00  _____________________________________________________________________  External Attachment:    Type:   Image     Comment:   External Document

## 2010-07-01 NOTE — Assessment & Plan Note (Signed)
Summary: ROA   Vital Signs:  Patient profile:   77 year old female Height:      61.5 inches Weight:      120.0 pounds Temp:     97.4 degrees F oral Pulse rate:   72 / minute Pulse rhythm:   regular BP sitting:   120 / 70  (left arm) Cuff size:   regular  Vitals Entered By: Benny Lennert CMA Duncan Dull) (June 17, 2009 11:13 AM)  History of Present Illness: Chief complaint follow up also need pt/irn checked  Doing well overeall.  DM,likely well controlled, but eating poorly over holidays.  Due for reeval.   Hypertension History:      well controlled at home. Marland Kitchen        Positive major cardiovascular risk factors include female age 39 years old or older, diabetes, hyperlipidemia, and hypertension.  Negative major cardiovascular risk factors include non-tobacco-user status.        Further assessment for target organ damage reveals no history of ASHD.     Problems Prior to Update: 1)  Knee Pain, Right  (ICD-719.46) 2)  Insomnia, Chronic  (ICD-307.42) 3)  Leg Pain, Right  (ICD-729.5) 4)  Atrial Fibrillation  (ICD-427.31) 5)  Cystitis, Acute  (ICD-595.0) 6)  Postmenopausal Osteoporosis  (ICD-733.01) 7)  Mammogram, Abnormal, Right  (ICD-793.80) 8)  Aodm  (ICD-250.00) 9)  Encounter For Therapeutic Drug Monitoring  (ICD-V58.83) 10)  Encounter For Long-term Use of Anticoagulants  (ICD-V58.61) 11)  Hypothyroidism  (ICD-244.9) 12)  Hyperlipidemia  (ICD-272.4) 13)  Anemia-iron Deficiency  (ICD-280.9) 14)  Peptic Ulcer Disease  (ICD-533.90) 15)  Gout  (ICD-274.9) 16)  Hypertension  (ICD-401.9) 17)  Paroxysmal Atrial Fibrillation  (ICD-427.31) 18)  Gerd  (ICD-530.81) 19)  Osteoarthritis  (ICD-715.90) 20)  Knee Pain, Left, Chronic  (ICD-719.46)  Current Medications (verified): 1)  Allopurinol 300 Mg  Tabs (Allopurinol) .... Take 1 Tablet By Mouth Once A Day 2)  Synthroid 50 Mcg  Tabs (Levothyroxine Sodium) .... Take 1 Tablet By Mouth Once A Day 3)  Metoprolol Succinate 25 Mg  Tb24  (Metoprolol Succinate) .... Take 1 Tablet By Mouth Once A Day 4)  Lisinopril-Hydrochlorothiazide 20-12.5 Mg Tabs (Lisinopril-Hydrochlorothiazide) .Marland Kitchen.. 1 Tab By Mouth Daily 5)  Simvastatin 20 Mg  Tabs (Simvastatin) .... Take 1 Tablet By Mouth Once A Day 6)  Diltiazem Hcl Cr 180 Mg  Cp24 (Diltiazem Hcl) .... Take 1 Tablet By Mouth Once A Day 7)  Warfarin Sodium 5 Mg  Tabs (Warfarin Sodium) .... As Directed 8)  Omeprazole 20 Mg  Cpdr (Omeprazole) .... Take 1 Tablet By Mouth Once A Day 9)  Eye-Vite Extra   Caps (Multiple Vitamins-Minerals) .... Take 1 Capsule By Mouth Once A Day 10)  Zolpidem Tartrate 5 Mg  Tabs (Zolpidem Tartrate) .... Take 1/2 Tab By Mouth At Bedtime As Needed  Allergies (verified): No Known Drug Allergies  Review of Systems General:  Denies fatigue and fever. CV:  Denies chest pain or discomfort. Resp:  Denies shortness of breath. GI:  Denies abdominal pain, bloody stools, constipation, and diarrhea. GU:  Denies dysuria. MS:  occ thumb pain from arthritis.Marland Kitchen  Physical Exam  General:  Elderly female in NAD Ears:  External ear exam shows no significant lesions or deformities.  Otoscopic examination reveals clear canals, tympanic membranes are intact bilaterally without bulging, retraction, inflammation or discharge. Hearing is grossly normal bilaterally. Nose:  External nasal examination shows no deformity or inflammation. Nasal mucosa are pink and moist without lesions or  exudates. Mouth:  Oral mucosa and oropharynx without lesions or exudates.  Teeth in good repair. Neck:  no carotid bruit or thyromegaly , no cervical or supraclavicular lymphadenopathy  Lungs:  Normal respiratory effort, chest expands symmetrically. Lungs are clear to auscultation, no crackles or wheezes. Heart:  Normal rate and regular rhythm. S1 and S2 normal without gallop, murmur, click, rub or other extra sounds. Abdomen:  Bowel sounds positive,abdomen soft and non-tender without masses, organomegaly  or hernias noted. No CVA tenderness Pulses:  R and L posterior tibial pulses are full and equal bilaterally  Extremities:  no edema  Diabetes Management Exam:    Foot Exam (with socks and/or shoes not present):       Sensory-Pinprick/Light touch:          Left medial foot (L-4): normal          Left dorsal foot (L-5): normal          Left lateral foot (S-1): normal          Right medial foot (L-4): normal          Right dorsal foot (L-5): normal          Right lateral foot (S-1): normal       Sensory-Monofilament:          Left foot: normal          Right foot: normal       Inspection:          Left foot: abnormal             Comments: hammar toes          Right foot: abnormal       Nails:          Left foot: too long          Right foot: too long    Eye Exam:       Eye Exam done elsewhere          Date: 06/16/2009          Results: macular deg          Done by: Dr. Dione Booze   Impression & Recommendations:  Problem # 1:  AODM (ICD-250.00) Well controlled. Her updated medication list for this problem includes:    Lisinopril-hydrochlorothiazide 20-12.5 Mg Tabs (Lisinopril-hydrochlorothiazide) .Marland Kitchen... 1 tab by mouth daily  Orders: TLB-Lipid Panel (80061-LIPID) TLB-BMP (Basic Metabolic Panel-BMET) (80048-METABOL) TLB-Hepatic/Liver Function Pnl (80076-HEPATIC) TLB-A1C / Hgb A1C (Glycohemoglobin) (83036-A1C)  Problem # 2:  PAROXYSMAL ATRIAL FIBRILLATION (ICD-427.31) Stable on coumadin, rate controlled. Due for INR.  Her updated medication list for this problem includes:    Metoprolol Succinate 25 Mg Tb24 (Metoprolol succinate) .Marland Kitchen... Take 1 tablet by mouth once a day    Diltiazem Hcl Cr 180 Mg Cp24 (Diltiazem hcl) .Marland Kitchen... Take 1 tablet by mouth once a day    Warfarin Sodium 5 Mg Tabs (Warfarin sodium) .Marland Kitchen... As directed  Problem # 3:  HYPERTENSION (ICD-401.9) Continue current medication.  Her updated medication list for this problem includes:    Metoprolol Succinate 25 Mg Tb24  (Metoprolol succinate) .Marland Kitchen... Take 1 tablet by mouth once a day    Lisinopril-hydrochlorothiazide 20-12.5 Mg Tabs (Lisinopril-hydrochlorothiazide) .Marland Kitchen... 1 tab by mouth daily    Diltiazem Hcl Cr 180 Mg Cp24 (Diltiazem hcl) .Marland Kitchen... Take 1 tablet by mouth once a day  Problem # 4:  GOUT (ICD-274.9) Due for reeval of uric acid.Marland Kitchenno further gout flares.  Her updated medication list for  this problem includes:    Allopurinol 300 Mg Tabs (Allopurinol) .Marland Kitchen... Take 1 tablet by mouth once a day  Orders: TLB-Uric Acid, Blood (84550-URIC)  Problem # 5:  HYPERLIPIDEMIA (ICD-272.4)  Due for reeval.   Labs Reviewed: SGOT: 29 (08/25/2008)   SGPT: 14 (08/25/2008)  Lipid Goals: Chol Goal: 200 (09/04/2007)   HDL Goal: 40 (09/04/2007)   LDL Goal: 100 (09/04/2007)   TG Goal: 150 (09/04/2007)  10 Yr Risk Heart Disease: 9 % Prior 10 Yr Risk Heart Disease: 13 % (09/09/2008)   HDL:60.50 (08/25/2008), 48.5 (12/03/2007)  LDL:67 (08/25/2008), 82 (12/03/2007)  Chol:146 (08/25/2008), 153 (12/03/2007)  Trig:95.0 (08/25/2008), 111 (12/03/2007)  Her updated medication list for this problem includes:    Simvastatin 20 Mg Tabs (Simvastatin) .Marland Kitchen... Take 1 tablet by mouth once a day  Complete Medication List: 1)  Allopurinol 300 Mg Tabs (Allopurinol) .... Take 1 tablet by mouth once a day 2)  Synthroid 50 Mcg Tabs (Levothyroxine sodium) .... Take 1 tablet by mouth once a day 3)  Metoprolol Succinate 25 Mg Tb24 (Metoprolol succinate) .... Take 1 tablet by mouth once a day 4)  Lisinopril-hydrochlorothiazide 20-12.5 Mg Tabs (Lisinopril-hydrochlorothiazide) .Marland Kitchen.. 1 tab by mouth daily 5)  Simvastatin 20 Mg Tabs (Simvastatin) .... Take 1 tablet by mouth once a day 6)  Diltiazem Hcl Cr 180 Mg Cp24 (Diltiazem hcl) .... Take 1 tablet by mouth once a day 7)  Warfarin Sodium 5 Mg Tabs (Warfarin sodium) .... As directed 8)  Omeprazole 20 Mg Cpdr (Omeprazole) .... Take 1 tablet by mouth once a day 9)  Eye-vite Extra Caps (Multiple  vitamins-minerals) .... Take 1 capsule by mouth once a day 10)  Zolpidem Tartrate 5 Mg Tabs (Zolpidem tartrate) .... Take 1/2 tab by mouth at bedtime as needed  Hypertension Assessment/Plan:      The patient's hypertensive risk group is category C: Target organ damage and/or diabetes.  Her calculated 10 year risk of coronary heart disease is 9 %.  Today's blood pressure is 120/70.  Her blood pressure goal is < 130/80.   Patient Instructions: 1)  Follow up appt in 6 months CPX 2)  Fasting lipids prior CMET, A1C, lipids Dx 250.00, TSH Dx 244.9,  Prescriptions: ZOLPIDEM TARTRATE 5 MG  TABS (ZOLPIDEM TARTRATE) Take 1/2 tab by mouth at bedtime as needed  #30 x 0   Entered and Authorized by:   Kerby Nora MD   Signed by:   Kerby Nora MD on 06/17/2009   Method used:   Print then Give to Patient   RxID:   [id number] METOPROLOL SUCCINATE 25 MG  TB24 (METOPROLOL SUCCINATE) Take 1 tablet by mouth once a day  #30 x 11   Entered and Authorized by:   Kerby Nora MD   Signed by:   Kerby Nora MD on 06/17/2009   Method used:   Print then Give to Patient   RxID:   [id number] ALLOPURINOL 300 MG  TABS (ALLOPURINOL) Take 1 tablet by mouth once a day  #30 x 11   Entered and Authorized by:   Kerby Nora MD   Signed by:   Kerby Nora MD on 06/17/2009   Method used:   Print then Give to Patient   RxID:   [id number] OMEPRAZOLE 20 MG  CPDR (OMEPRAZOLE) Take 1 tablet by mouth once a day  #30 x 11   Entered and Authorized by:   Kerby Nora MD   Signed by:   Kerby Nora MD on 06/17/2009   Method  used:   Print then Give to Patient   RxID:   [id number] SIMVASTATIN 20 MG  TABS (SIMVASTATIN) Take 1 tablet by mouth once a day  #30 x 11   Entered and Authorized by:   Kerby Nora MD   Signed by:   Kerby Nora MD on 06/17/2009   Method used:   Print then Give to Patient   RxID:   [id number]   Current Allergies (reviewed today): No known allergies

## 2010-07-01 NOTE — Letter (Signed)
Summary: [facility]   Imported By: Lanelle Bal 02/15/2010 13:14:15  _____________________________________________________________________  External Attachment:    Type:   Image     Comment:   External Document

## 2010-07-01 NOTE — Progress Notes (Signed)
Summary: has non productive cough  Phone Note Call from Patient   Caller: Patient Call For: [name] MD Summary of Call: Pt states she has had a non productive cough for about a week and a half.  No fever, no drainage, no other problems- says she feels fine otherwise.  Drinks a lot of fluids.  Uses [facility] [address].   Initial call taken by: Lowella Petties CMA, AAMA,  April 14, 2010 3:11 PM  Follow-up for Phone Call        laurie, please discuss uri management with this patient. Spencer Copland MD  April 16, 2010 10:22 AM   tessalon perles 100 mg, 1 by mouth three times a day, #40. as needed cough  Additional Follow-up for Phone Call Additional follow up Details #1::        No answer.              Lowella Petties CMA, AAMA  April 16, 2010 10:39 AM  Spoke with pt, she says the cough has cleared up and she no longer needs any medicine. Additional Follow-up by: Lowella Petties CMA, AAMA,  April 20, 2010 4:46 PM

## 2010-07-01 NOTE — Letter (Signed)
Summary: [facility]   Imported By: Lanelle Bal 05/26/2010 15:05:42  _____________________________________________________________________  External Attachment:    Type:   Image     Comment:   External Document

## 2010-07-01 NOTE — Assessment & Plan Note (Signed)
Summary: surg-cancer in [place]-removal.    Visit Type:  Pre-op Evaluation  CC:  increased heart rate.  History of Present Illness: Patient is an 77 year old with a history of PAF,hypertension, dyslipidemia.  No known CAD  I saw her in 2008. Presents as preop eval for Breast surgery She is active.  Walks some.  Housework.  No chest pain.  No shortness of breath Has felt her heart racing 3x  in bed since finding out about the breast surgery.  No dizziness.  Thinks it was her nerves.  No other spells.  No dizziness.  Current Medications (verified): 1)  Allopurinol 300 Mg  Tabs (Allopurinol) .... Take 1 Tablet By Mouth Once A Day 2)  Synthroid 50 Mcg  Tabs (Levothyroxine Sodium) .... Take 1 Tablet By Mouth Once A Day 3)  Metoprolol Succinate 25 Mg  Tb24 (Metoprolol Succinate) .... Take 1 Tablet By Mouth Once A Day 4)  Lisinopril-Hydrochlorothiazide 20-12.5 Mg Tabs (Lisinopril-Hydrochlorothiazide) .... Take One By Mouth Daily 5)  Simvastatin 20 Mg  Tabs (Simvastatin) .... Take 1 Tablet By Mouth Once A Day 6)  Warfarin Sodium 5 Mg  Tabs (Warfarin Sodium) .... As Directed 7)  Omeprazole 20 Mg  Cpdr (Omeprazole) .... Take 1 Tablet By Mouth Once A Day 8)  Eye-Vite Extra   Caps (Multiple Vitamins-Minerals) .... Take 1 Capsule By Mouth Once A Day 9)  Zolpidem Tartrate 5 Mg  Tabs (Zolpidem Tartrate) .... Take 1/2 Tab By Mouth At Bedtime As Needed  Allergies: No Known Drug Allergies  Past History:  Past Medical History: Last updated: 06/05/2007 Osteoarthritis GERD Hypertension Gout Peptic ulcer disease Anemia-iron deficiency Hyperlipidemia  Past Surgical History: Last updated: 06/05/2007 2006 L  knee fracture endoscopy 4/08: PUD 4 C-sections Appendectomy  Family History: Last updated: 06/05/2007 father; ? mother: DM sister: DM, breast cancer ? grandparents health history  Social History: Last updated: 06/05/2007 Occupation: Worked at a [employer] widowed Never Smoked Alcohol  use-no Drug use-no Regular exercise-yes, walking 2 times per day 0.5 mile Diet: fruits and veggies, water  Review of Systems       All systems reviewed.  Neg to the above problem except as noted above.  Vital Signs:  Patient profile:   77 year old female Height:      61.5 inches Weight:      124 pounds Pulse rate:   80 / minute Pulse rhythm:   regular BP sitting:   110 / 80  (right arm)  Vitals Entered By: Jacquelin Hawking, CMA (February 08, 2010 9:28 AM)  Physical Exam  Additional Exam:  patient is in NAD. HEENT:  Normocephalic, atraumatic. EOMI, PERRLA.  Neck: JVP is normal. No thyromegaly. No bruits.  Lungs: clear to auscultation. No rales no wheezes.  Heart: Regular rate and rhythm. Normal S1, S2. No S3.   No significant murmurs. PMI not displaced.  Abdomen:  Supple, nontender. Normal bowel sounds. No masses. No hepatomegaly.  Extremities:   Good distal pulses throughout. No lower extremity edema.  Musculoskeletal :moving all extremities.  Neuro:   alert and oriented x3.    Impression & Recommendations:  Problem # 1:  PREOPERATIVE EXAMINATION (ICD-V72.84) Patient is being evaluated for breast surgery.  From a cardiac standpoint she is doing well.  She is active without symptoms. I think she is at low risk for a major cardiac event with surgery and can proceed without further testing.  Problem # 2:  ATRIAL FIBRILLATION (ICD-427.31) Currently in SR.  No history of TIA/CVA.  OK to come off of coumadin prior to surgery.  Resume after.  Problem # 3:  HYPERLIPIDEMIA (ICD-272.4) Very good control earlier this year.  LDL 83, HDL 59 Her updated medication list for this problem includes:    Simvastatin 20 Mg Tabs (Simvastatin) .Marland Kitchen... Take 1 tablet by mouth once a day  Problem # 4:  HYPERTENSION (ICD-401.9) Good control  Other Orders: EKG w/ Interpretation (93000)  Patient Instructions: 1)  Your physician wants you to follow-up in: 12 months  You will receive a reminder  letter in the mail two months in advance. If you don't receive a letter, please call our office to schedule the follow-up appointment. Prescriptions: ZOLPIDEM TARTRATE 5 MG  TABS (ZOLPIDEM TARTRATE) Take 1/2 tab by mouth at bedtime as needed  #30 x 1   Entered by:   Layne Benton, RN, BSN   Authorized by:   Sherrill Raring, MD, [facility]   Signed by:   Layne Benton, RN, BSN on 02/08/2010   Method used:   Print then Give to Patient   RxID:   [id number]

## 2010-07-01 NOTE — Progress Notes (Signed)
Summary: Rx Zolpidem  Phone Note Call from Patient   Caller: Patient Call For: [name] Nora MD Summary of Call: Patient called and requested a refill on Zolpidem and states that she does not take these very often and just likes to have some on hand. Pharmacy- [facility]. Initial call taken by: Sydell Axon LPN,  December 28, 2009 11:38 AM  Follow-up for Phone Call        Caution her about SE from this medicaiton in the elderly (sedation, confusion, falls)..minimize use.  Follow-up by: [name] Nora MD,  December 28, 2009 11:47 AM  Additional Follow-up for Phone Call Additional follow up Details #1::        No answer. Sydell Axon LPN  December 28, 2009 4:59 PM No answer. Sydell Axon LPN  December 29, 2009 9:53 AM  Advised pt. She says she only takes about one a month.            Lowella Petties CMA  December 29, 2009 12:16 PM     Additional Follow-up for Phone Call Additional follow up Details #2::    rx called to pharmacy.Consuello Masse CMA   Follow-up by: Benny Lennert CMA Duncan Dull),  December 29, 2009 9:58 AM  Prescriptions: ZOLPIDEM TARTRATE 5 MG  TABS (ZOLPIDEM TARTRATE) Take 1/2 tab by mouth at bedtime as needed  #30 x 0   Entered and Authorized by:   [name] Nora MD   Signed by:   [name] Nora MD on 12/28/2009   Method used:   Telephoned to ...       [facility] [address] [phone]* (retail)       [address]       [place], [place]  [address]       Ph: [phone]       Fax: [phone]   RxID:   [id number]

## 2010-07-01 NOTE — Letter (Signed)
Summary: [facility]  [facility]ult/[facility]   Imported By: Sherian Rein 02/04/2010 09:07:32  _____________________________________________________________________  External Attachment:    Type:   Image     Comment:   External Document

## 2010-07-01 NOTE — Progress Notes (Signed)
Summary: refill request for diltiazem  Phone Note Refill Request Message from:  Fax from Pharmacy  Refills Requested: Medication #1:  diltiazem 180 mg   Last Refilled: 01/21/2010 Faxed request from [facility] [address], this is no longer on med list.  Initial call taken by: Lowella Petties CMA,  February 22, 2010 10:59 AM  Follow-up for Phone Call        I believe she should still be on this - i see nothing in our record about d/c this from Dr. Ermalene Searing or Dr. Tenny Craw and suspect error. Long-term medication.  Refill x 1 year Follow-up by: [name] MD,  February 22, 2010 11:05 AM  Additional Follow-up for Phone Call Additional follow up Details #1::        Rx faxed to pharmacy Additional Follow-up by: Benny Lennert CMA Duncan Dull),  February 22, 2010 11:25 AM    New/Updated Medications: DILTIAZEM HCL CR 180 MG XR24H-CAP (DILTIAZEM HCL) take one tablet daily Prescriptions: DILTIAZEM HCL CR 180 MG XR24H-CAP (DILTIAZEM HCL) take one tablet daily  #30 x 12   Entered by:   Benny Lennert CMA (AAMA)   Authorized by:   [name] MD   Signed by:   Benny Lennert CMA (AAMA) on 02/22/2010   Method used:   Electronically to        [facility] (retail)       [address]       [place], [place]  [address]       Ph: [phone]       Fax: [phone]   RxID:   [id number] DILTIAZEM HCL CR 180 MG XR24H-CAP (DILTIAZEM HCL) take one tablet daily  #30 x 3   Entered by:   Benny Lennert CMA (AAMA)   Authorized by:   [name] MD   Signed by:   Benny Lennert CMA (AAMA) on 02/22/2010   Method used:   Electronically to        [facility] (retail)       [address]       [place], [place]  [address]       Ph: [phone]       Fax: [phone]   RxID:   [id number]

## 2010-07-01 NOTE — Assessment & Plan Note (Signed)
Summary: 6 MONTH FOLLOW UP   Vital Signs:  Patient profile:   77 year old female Height:      61.5 inches Weight:      124.75 pounds BMI:     23.27 Temp:     97.5 degrees F oral Pulse rate:   80 / minute Pulse rhythm:   regular BP sitting:   130 / 80  (left arm) Cuff size:   regular  Vitals Entered By: Benny Lennert CMA Duncan Dull) (June 10, 2010 9:01 AM)  History of Present Illness: Chief complaint 6 month follow up multiple health issues. Recent L breast cancer, s/p partial mastectomy.  HTN: stable, doing well, tolerating all meds and at goal  Gout: no problems, no recent gout flares on allopurinol  Anemia: currently not anemic  Lipids: stable, panel reviewed  Started Arimedex, from Dr. Caron Presume.  Current Problems (verified): 1)  Infiltrating Ductal Carcinoma, Left Breast  (ICD-174.9) 2)  Insomnia, Chronic  (ICD-307.42) 3)  Postmenopausal Osteoporosis  (ICD-733.01) 4)  Aodm  (ICD-250.00) 5)  Encounter For Therapeutic Drug Monitoring  (ICD-V58.83) 6)  Encounter For Long-term Use of Anticoagulants  (ICD-V58.61) 7)  Hypothyroidism  (ICD-244.9) 8)  Hyperlipidemia  (ICD-272.4) 9)  Anemia-iron Deficiency  (ICD-280.9) 10)  Peptic Ulcer Disease  (ICD-533.90) 11)  Gout  (ICD-274.9) 12)  Hypertension  (ICD-401.9) 13)  Paroxysmal Atrial Fibrillation  (ICD-427.31) 14)  Gerd  (ICD-530.81) 15)  Osteoarthritis  (ICD-715.90)  Allergies (verified): No Known Drug Allergies  Past History:  Past medical, surgical, family and social histories (including risk factors) reviewed, and no changes noted (except as noted below).  Past Medical History: Left breast cancer, partial mastectomy Osteoarthritis GERD Hypertension Gout Peptic ulcer disease Anemia-iron deficiency Hyperlipidemia  Past Surgical History: Reviewed history from 06/05/2007 and no changes required. 2006 L  knee fracture endoscopy 4/08: PUD 4 C-sections Appendectomy  Family History: Reviewed history from  06/05/2007 and no changes required. father; ? mother: DM sister: DM, breast cancer ? grandparents health history  Social History: Reviewed history from 06/05/2007 and no changes required. Occupation: Worked at a [employer] widowed Never Smoked Alcohol use-no Drug use-no Regular exercise-yes, walking 2 times per day 0.5 mile Diet: fruits and veggies, water  Review of Systems       REVIEW OF SYSTEMS GEN: No acute illnesses, no fever, chills, sweats. CV: No chest pain or SOB GI: No noted N or V Otherwise, pertinent positives and negatives are noted in the HPI.   Physical Exam  Additional Exam:  GEN: WDWN, NAD, Non-toxic, A & O x 3 HEENT: Atraumatic, Normocephalic. Neck supple. No masses, No LAD. Ears and Nose: No external deformity. CV: RRR, No M/G/R. No JVD. No thrill. No extra heart sounds. PULM: CTA B, no wheezes, crackles, rhonchi. No retractions. No resp. distress. No accessory muscle use. EXTR: No c/c/e NEURO: Normal gait.  PSYCH: Normally interactive. Conversant. Not depressed or anxious appearing.  Calm demeanor.     Impression & Recommendations:  Problem # 1:  HYPERLIPIDEMIA (ICD-272.4) Assessment Unchanged  Her updated medication list for this problem includes:    Simvastatin 20 Mg Tabs (Simvastatin) .Marland Kitchen... Take 1 tablet by mouth once a day  Labs Reviewed: SGOT: 28 (06/03/2010)   SGPT: 16 (06/03/2010)  Lipid Goals: Chol Goal: 200 (09/04/2007)   HDL Goal: 40 (09/04/2007)   LDL Goal: 100 (09/04/2007)   TG Goal: 150 (09/04/2007)  Prior 10 Yr Risk Heart Disease: 9 % (06/17/2009)   HDL:51.80 (06/03/2010), 58.50 (10/29/2009)  LDL:73 (06/03/2010), 83 (10/29/2009)  Chol:149 (06/03/2010), 160 (10/29/2009)  Trig:120.0 (06/03/2010), 94.0 (10/29/2009)  Problem # 2:  HYPOTHYROIDISM (ICD-244.9) Assessment: Unchanged  Her updated medication list for this problem includes:    Synthroid 50 Mcg Tabs (Levothyroxine sodium) .Marland Kitchen... Take 1 tablet by mouth once a day  Problem # 3:   HYPERTENSION (ICD-401.9) Assessment: Unchanged  Her updated medication list for this problem includes:    Metoprolol Succinate 25 Mg Tb24 (Metoprolol succinate) .Marland Kitchen... Take 1 tablet by mouth once a day    Lisinopril-hydrochlorothiazide 20-12.5 Mg Tabs (Lisinopril-hydrochlorothiazide) .Marland Kitchen... Take one by mouth daily    Diltiazem Hcl Cr 180 Mg Xr24h-cap (Diltiazem hcl) .Marland Kitchen... Take one tablet daily  BP today: 130/80 Prior BP: 110/70 (02/17/2010)  Prior 10 Yr Risk Heart Disease: 9 % (06/17/2009)  Labs Reviewed: K+: 3.7 (02/17/2010) Creat: : 1.2 (02/17/2010)   Chol: 149 (06/03/2010)   HDL: 51.80 (06/03/2010)   LDL: 73 (06/03/2010)   TG: 120.0 (06/03/2010)  Problem # 4:  GOUT (ICD-274.9) Assessment: Unchanged  Her updated medication list for this problem includes:    Allopurinol 300 Mg Tabs (Allopurinol) .Marland Kitchen... Take 1 tablet by mouth once a day  Problem # 5:  INFILTRATING DUCTAL CARCINOMA, LEFT BREAST (ICD-174.9) doing well.  Complete Medication List: 1)  Allopurinol 300 Mg Tabs (Allopurinol) .... Take 1 tablet by mouth once a day 2)  Synthroid 50 Mcg Tabs (Levothyroxine sodium) .... Take 1 tablet by mouth once a day 3)  Metoprolol Succinate 25 Mg Tb24 (Metoprolol succinate) .... Take 1 tablet by mouth once a day 4)  Lisinopril-hydrochlorothiazide 20-12.5 Mg Tabs (Lisinopril-hydrochlorothiazide) .... Take one by mouth daily 5)  Simvastatin 20 Mg Tabs (Simvastatin) .... Take 1 tablet by mouth once a day 6)  Warfarin Sodium 5 Mg Tabs (Warfarin sodium) .... As directed 7)  Omeprazole 20 Mg Cpdr (Omeprazole) .... Take 1 tablet by mouth once a day 8)  Eye-vite Extra Caps (Multiple vitamins-minerals) .... Take 1 capsule by mouth once a day 9)  Zolpidem Tartrate 5 Mg Tabs (Zolpidem tartrate) .... Take 1/2 tab by mouth at bedtime as needed 10)  Meclizine Hcl 25 Mg Tabs (Meclizine hcl) .... 1/2 to 1 by mouth two times a day as needed vertigo 11)  Diltiazem Hcl Cr 180 Mg Xr24h-cap (Diltiazem hcl)  .... Take one tablet daily 12)  Arimidex 1 Mg Tabs (Anastrozole) .... Take one tablet daily 13)  Tramadol Hcl 50 Mg Tabs (Tramadol hcl) .Marland Kitchen.. 1 by mouth 4 times daily as needed pain 14)  Zolpidem Tartrate 10 Mg Tabs (Zolpidem tartrate) .... 1/2 to 1 tab by mouth at bedtime  Patient Instructions: 1)  OK TO TAKE TRAMADOL AS LONG WITH TYLENOL 2)  DO NOT TAKE ADVIL OR ALLEVE - IT CAN INTERACT WITH COUMADIN. 3)  follow-up 6 months for Medicare wellness exam (30 min) 4)  BMP prior to visit, ICD-9: 272.4, v58.69 5)  TSH prior to visit ICD-9 : 244.9 6)  CBC w/ Diff prior to visit ICD-9 :  Prescriptions: TRAMADOL HCL 50 MG  TABS (TRAMADOL HCL) 1 by mouth 4 times daily as needed pain  #50 x 3   Entered and Authorized by:   [name] MD   Signed by:   [name] MD on 06/10/2010   Method used:   Electronically to        [facility] [id number]* (retail)       [address]       [place], [place]  [address]  Ph: [phone]       Fax: [phone]   RxID:   [id number]    Orders Added: 1)  Est. Patient Level IV [62952]    Current Allergies (reviewed today): No known allergies

## 2010-07-01 NOTE — Letter (Signed)
Summary: [facility]   Imported By: Lester Van 04/21/2010 12:25:37  _____________________________________________________________________  External Attachment:    Type:   Image     Comment:   External Document

## 2010-07-01 NOTE — Progress Notes (Signed)
Summary: having breast biopsy  Phone Note Call from Patient Call back at Home Phone [phone]   Caller: Patient Summary of Call: Pt is having a breast needle biopsy done on friday and she is asking if ok to continue her coumadin.   Initial call taken by: Lowella Petties CMA,  January 06, 2010 1:10 PM  Follow-up for Phone Call        I suspect that it is OK for her to remain on Coumadin.  This needs to be asked of the physician performing the biopsy to confirm with them. (ie. call the breast center if they are doing it.) Follow-up by: [name] MD,  January 06, 2010 5:12 PM  Additional Follow-up for Phone Call Additional follow up Details #1::        Patient advised.Consuello Masse CMA   Additional Follow-up by: Benny Lennert CMA Duncan Dull),  January 07, 2010 8:53 AM

## 2010-07-01 NOTE — Letter (Signed)
Summary: [facility]  [facility]   Imported By: Sherian Rein 04/20/2010 14:45:03  _____________________________________________________________________  External Attachment:    Type:   Image     Comment:   External Document

## 2010-07-07 NOTE — Medication Information (Signed)
Summary: PROTIME /TW   Indication 1: Atrial fibrillation PT 23.6 INR RANGE 2.0-3.0           Allergies: No Known Drug Allergies  Anticoagulation Management History:      Positive risk factors for bleeding include an age of 75 years or older and presence of serious comorbidities.  The bleeding index is 'intermediate risk'.  Positive CHADS2 values include History of HTN, Age > 75 years old, and History of Diabetes.  Her last INR was 2.7 and today's INR is 2.0.  Prothrombin time is 23.6.    Anticoagulation Management Assessment/Plan:      The patient's current anticoagulation dose is Warfarin sodium 5 mg  tabs: as directed.  The next INR is due 4 weeks.         ANTICOAGULATION RECORD PREVIOUS REGIMEN & LAB RESULTS Anticoagulation Diagnosis:  Atrial fibrillation on  06/17/2009 Previous INR Goal Range:  2.0-3.0 on  06/17/2009 Previous INR:  2.7 on  06/03/2010 Previous Coumadin Dose(mg):  2.5 mg qd, 5mg  T,TH on  05/06/2010 Previous Regimen:  2.5 mg qd, 5mg  T,TH on  05/06/2010 Previous Coagulation Comments:  . on  04/02/2010  NEW REGIMEN & LAB RESULTS Current INR: 2.0 Current Coumadin Dose(mg): 2.5 mg daily, 5mg  tues, thurs Regimen: 2.5 mg daily, 5mg  tues, thurs  Provider: Topher Buenaventura Repeat testing in: 4 weeks Dose has been reviewed with patient or caretaker during this visit. Reviewed by: Celso Sickle  Anticoagulation Visit Questionnaire Coumadin dose missed/changed:  No Abnormal Bleeding Symptoms:  No  Any diet changes including alcohol intake, vegetables or greens since the last visit:  No Any illnesses or hospitalizations since the last visit:  No Any signs of clotting since the last visit (including chest discomfort, dizziness, shortness of breath, arm tingling, slurred speech, swelling or redness in leg):  No  MEDICATIONS ALLOPURINOL 300 MG  TABS (ALLOPURINOL) Take 1 tablet by mouth once a day SYNTHROID 50 MCG  TABS (LEVOTHYROXINE SODIUM) Take 1 tablet by mouth  once a day METOPROLOL SUCCINATE 25 MG  TB24 (METOPROLOL SUCCINATE) Take 1 tablet by mouth once a day LISINOPRIL-HYDROCHLOROTHIAZIDE 20-12.5 MG TABS (LISINOPRIL-HYDROCHLOROTHIAZIDE) take one by mouth daily SIMVASTATIN 20 MG  TABS (SIMVASTATIN) Take 1 tablet by mouth once a day WARFARIN SODIUM 5 MG  TABS (WARFARIN SODIUM) as directed OMEPRAZOLE 20 MG  CPDR (OMEPRAZOLE) Take 1 tablet by mouth once a day EYE-VITE EXTRA   CAPS (MULTIPLE VITAMINS-MINERALS) Take 1 capsule by mouth once a day ZOLPIDEM TARTRATE 5 MG  TABS (ZOLPIDEM TARTRATE) Take 1/2 tab by mouth at bedtime as needed MECLIZINE HCL 25 MG TABS (MECLIZINE HCL) 1/2 to 1 by mouth two times a day as needed vertigo DILTIAZEM HCL CR 180 MG XR24H-CAP (DILTIAZEM HCL) take one tablet daily ARIMIDEX 1 MG TABS (ANASTROZOLE) take one tablet daily TRAMADOL HCL 50 MG  TABS (TRAMADOL HCL) 1 by mouth 4 times daily as needed pain ZOLPIDEM TARTRATE 10 MG TABS (ZOLPIDEM TARTRATE) 1/2 to 1 tab by mouth at bedtime     Laboratory Results   Blood Tests   Date/Time Received: July 01, 2010 11:09 AM Date/Time Reported: July 01, 2010 11:09 AM  PT: 23.6 s   (Normal Range: 10.6-13.4)  INR: 2.0   (Normal Range: 0.88-1.12   Therap INR: 2.0-3.5)

## 2010-07-08 ENCOUNTER — Ambulatory Visit: Payer: MEDICARE | Admitting: Family Medicine

## 2010-07-12 ENCOUNTER — Telehealth: Payer: Self-pay | Admitting: Family Medicine

## 2010-07-13 ENCOUNTER — Other Ambulatory Visit (INDEPENDENT_AMBULATORY_CARE_PROVIDER_SITE_OTHER): Payer: MEDICARE

## 2010-07-13 ENCOUNTER — Encounter: Payer: Self-pay | Admitting: Family Medicine

## 2010-07-13 DIAGNOSIS — I4891 Unspecified atrial fibrillation: Secondary | ICD-10-CM

## 2010-07-13 DIAGNOSIS — Z5181 Encounter for therapeutic drug level monitoring: Secondary | ICD-10-CM

## 2010-07-13 DIAGNOSIS — Z7901 Long term (current) use of anticoagulants: Secondary | ICD-10-CM

## 2010-07-13 LAB — CONVERTED CEMR LAB: INR: 1.8

## 2010-07-21 NOTE — Progress Notes (Signed)
Summary: does pt need protime?  Phone Note Call from Patient Call back at [facility] Phone [phone]   Caller: Patient Summary of Call: Pt is taking cipro for UTI, she was given this at  urgent care.  She was told to get her protime checked within 3-4 days.  OK to schedule?        Lowella Petties CMA, AAMA  July 12, 2010 8:35 AM   Follow-up for Phone Call        Yes.  Follow-up by: Kerby Nora MD,  July 12, 2010 8:41 AM  Additional Follow-up for Phone Call Additional follow up Details #1::        Called pt. No answer.            Lowella Petties CMA, AAMA  July 12, 2010 9:13 AM   Lab appt made. Additional Follow-up by: Lowella Petties CMA, AAMA,  July 12, 2010 10:30 AM

## 2010-07-28 ENCOUNTER — Encounter: Payer: Self-pay | Admitting: Family Medicine

## 2010-07-28 ENCOUNTER — Ambulatory Visit (INDEPENDENT_AMBULATORY_CARE_PROVIDER_SITE_OTHER): Payer: Medicare Other

## 2010-07-28 DIAGNOSIS — I4891 Unspecified atrial fibrillation: Secondary | ICD-10-CM

## 2010-07-28 DIAGNOSIS — Z7901 Long term (current) use of anticoagulants: Secondary | ICD-10-CM

## 2010-07-28 DIAGNOSIS — Z5181 Encounter for therapeutic drug level monitoring: Secondary | ICD-10-CM

## 2010-07-29 ENCOUNTER — Ambulatory Visit: Payer: MEDICARE

## 2010-08-05 NOTE — Medication Information (Signed)
Summary: Protime/tsc   Indication 1: Atrial fibrillation PT 26.7 INR RANGE 2.0-3.0           Allergies: No Known Drug Allergies  Anticoagulation Management History:      Positive risk factors for bleeding include an age of 7 years or older and presence of serious comorbidities.  The bleeding index is 'intermediate risk'.  Positive CHADS2 values include History of HTN, Age > 75 years old, and History of Diabetes.  Her last INR was 1.8 and today's INR is 2.2.  Prothrombin time is 26.7.    Anticoagulation Management Assessment/Plan:      The patient's current anticoagulation dose is Warfarin sodium 5 mg  tabs: as directed.  The next INR is due 4 weeks.         ANTICOAGULATION RECORD PREVIOUS REGIMEN & LAB RESULTS Anticoagulation Diagnosis:  Atrial fibrillation on  06/17/2009 Previous INR Goal Range:  2.0-3.0 on  06/17/2009 Previous INR:  1.8 on  07/13/2010 Previous Coumadin Dose(mg):  2.5 mg daily, 5mg  tues, thurs on  07/01/2010 Previous Regimen:  2.5 mg daily, 5mg  tues, thurs on  07/01/2010 Previous Coagulation Comments:  . on  04/02/2010  NEW REGIMEN & LAB RESULTS Current INR: 2.2 Current Coumadin Dose(mg):  2.5 mg daily, 5mg  tues, thurs Regimen:  2.5 mg daily, 5mg  tues, thurs  Provider: Fritzie Prioleau Repeat testing in: 4 weeks Dose has been reviewed with patient or caretaker during this visit. Reviewed by: Celso Sickle  Anticoagulation Visit Questionnaire Coumadin dose missed/changed:  No Abnormal Bleeding Symptoms:  No  Any diet changes including alcohol intake, vegetables or greens since the last visit:  No Any illnesses or hospitalizations since the last visit:  No Any signs of clotting since the last visit (including chest discomfort, dizziness, shortness of breath, arm tingling, slurred speech, swelling or redness in leg):  No  MEDICATIONS ALLOPURINOL 300 MG  TABS (ALLOPURINOL) Take 1 tablet by mouth once a day SYNTHROID 50 MCG  TABS (LEVOTHYROXINE SODIUM) Take  1 tablet by mouth once a day METOPROLOL SUCCINATE 25 MG  TB24 (METOPROLOL SUCCINATE) Take 1 tablet by mouth once a day LISINOPRIL-HYDROCHLOROTHIAZIDE 20-12.5 MG TABS (LISINOPRIL-HYDROCHLOROTHIAZIDE) take one by mouth daily SIMVASTATIN 20 MG  TABS (SIMVASTATIN) Take 1 tablet by mouth once a day WARFARIN SODIUM 5 MG  TABS (WARFARIN SODIUM) as directed OMEPRAZOLE 20 MG  CPDR (OMEPRAZOLE) Take 1 tablet by mouth once a day EYE-VITE EXTRA   CAPS (MULTIPLE VITAMINS-MINERALS) Take 1 capsule by mouth once a day ZOLPIDEM TARTRATE 5 MG  TABS (ZOLPIDEM TARTRATE) Take 1/2 tab by mouth at bedtime as needed MECLIZINE HCL 25 MG TABS (MECLIZINE HCL) 1/2 to 1 by mouth two times a day as needed vertigo DILTIAZEM HCL CR 180 MG XR24H-CAP (DILTIAZEM HCL) take one tablet daily ARIMIDEX 1 MG TABS (ANASTROZOLE) take one tablet daily TRAMADOL HCL 50 MG  TABS (TRAMADOL HCL) 1 by mouth 4 times daily as needed pain ZOLPIDEM TARTRATE 10 MG TABS (ZOLPIDEM TARTRATE) 1/2 to 1 tab by mouth at bedtime    Laboratory Results   Blood Tests   Date/Time Received: July 28, 2010 11:22 AM Date/Time Reported: July 28, 2010 11:22 AM  PT: 26.7 s   (Normal Range: 10.6-13.4)  INR: 2.2   (Normal Range: 0.88-1.12   Therap INR: 2.0-3.5)

## 2010-08-09 ENCOUNTER — Encounter (HOSPITAL_BASED_OUTPATIENT_CLINIC_OR_DEPARTMENT_OTHER): Payer: Medicare Other | Admitting: Oncology

## 2010-08-09 ENCOUNTER — Other Ambulatory Visit: Payer: Self-pay | Admitting: Oncology

## 2010-08-09 DIAGNOSIS — Z7901 Long term (current) use of anticoagulants: Secondary | ICD-10-CM

## 2010-08-09 DIAGNOSIS — C50919 Malignant neoplasm of unspecified site of unspecified female breast: Secondary | ICD-10-CM

## 2010-08-09 DIAGNOSIS — C50419 Malignant neoplasm of upper-outer quadrant of unspecified female breast: Secondary | ICD-10-CM

## 2010-08-09 DIAGNOSIS — I4891 Unspecified atrial fibrillation: Secondary | ICD-10-CM

## 2010-08-09 LAB — CBC WITH DIFFERENTIAL/PLATELET
BASO%: 0.3 % (ref 0.0–2.0)
EOS%: 1.5 % (ref 0.0–7.0)
Eosinophils Absolute: 0.1 10*3/uL (ref 0.0–0.5)
LYMPH%: 25.1 % (ref 14.0–49.7)
MCH: 29.9 pg (ref 25.1–34.0)
MCHC: 32.9 g/dL (ref 31.5–36.0)
MCV: 90.9 fL (ref 79.5–101.0)
MONO%: 7.8 % (ref 0.0–14.0)
Platelets: 296 10*3/uL (ref 145–400)
RBC: 4.08 10*6/uL (ref 3.70–5.45)

## 2010-08-09 LAB — COMPREHENSIVE METABOLIC PANEL
Alkaline Phosphatase: 75 U/L (ref 39–117)
Glucose, Bld: 94 mg/dL (ref 70–99)
Sodium: 139 mEq/L (ref 135–145)
Total Bilirubin: 0.6 mg/dL (ref 0.3–1.2)
Total Protein: 7.4 g/dL (ref 6.0–8.3)

## 2010-08-11 LAB — COMPREHENSIVE METABOLIC PANEL
Alkaline Phosphatase: 83 U/L (ref 39–117)
BUN: 17 mg/dL (ref 6–23)
Chloride: 101 mEq/L (ref 96–112)
GFR calc non Af Amer: 37 mL/min — ABNORMAL LOW (ref >60)
Glucose, Bld: 120 mg/dL — ABNORMAL HIGH (ref 70–99)
Potassium: 3.4 mEq/L — ABNORMAL LOW (ref 3.5–5.1)
Total Bilirubin: 0.4 mg/dL (ref 0.3–1.2)
Total Protein: 7.2 g/dL (ref 6.0–8.3)

## 2010-08-11 LAB — APTT: aPTT: 35 seconds (ref 24–37)

## 2010-08-11 LAB — PROTIME-INR: INR: 2.55 — ABNORMAL HIGH (ref 0.00–1.49)

## 2010-08-11 LAB — DIFFERENTIAL
Basophils Absolute: 0 10*3/uL (ref 0.0–0.1)
Basophils Relative: 1 % (ref 0–1)
Neutro Abs: 4.7 10*3/uL (ref 1.7–7.7)
Neutrophils Relative %: 60 % (ref 43–77)

## 2010-08-11 LAB — CBC
HCT: 40.1 % (ref 36.0–46.0)
MCV: 90.3 fL (ref 78.0–100.0)
RDW: 15.2 % (ref 11.5–15.5)
WBC: 7.9 10*3/uL (ref 4.0–10.5)

## 2010-08-12 ENCOUNTER — Telehealth: Payer: Self-pay | Admitting: Family Medicine

## 2010-08-16 ENCOUNTER — Ambulatory Visit (INDEPENDENT_AMBULATORY_CARE_PROVIDER_SITE_OTHER): Payer: Medicare Other | Admitting: Family Medicine

## 2010-08-16 ENCOUNTER — Encounter: Payer: Self-pay | Admitting: Family Medicine

## 2010-08-16 ENCOUNTER — Telehealth: Payer: Self-pay | Admitting: Family Medicine

## 2010-08-16 DIAGNOSIS — Z7901 Long term (current) use of anticoagulants: Secondary | ICD-10-CM

## 2010-08-16 DIAGNOSIS — I4891 Unspecified atrial fibrillation: Secondary | ICD-10-CM

## 2010-08-16 DIAGNOSIS — Z5181 Encounter for therapeutic drug level monitoring: Secondary | ICD-10-CM

## 2010-08-16 DIAGNOSIS — H8110 Benign paroxysmal vertigo, unspecified ear: Secondary | ICD-10-CM | POA: Insufficient documentation

## 2010-08-16 LAB — CONVERTED CEMR LAB: INR: 3

## 2010-08-17 NOTE — Progress Notes (Signed)
Summary: ok to take allegra  Phone Note Call from Patient Call back at Home Phone [phone]   Caller: Patient Call For: [name] MD Summary of Call: Pt is askiing if ok for her to take allegra, she is taking otc allergy medicine now but says allegra would cost less.  Advised yes, plain allegra- not D. Initial call taken by: Lowella Petties CMA, AAMA,  August 12, 2010 10:43 AM  Follow-up for Phone Call        agreed Follow-up by: [name] MD,  August 12, 2010 11:04 AM

## 2010-08-25 ENCOUNTER — Ambulatory Visit: Payer: Medicare Other

## 2010-08-26 ENCOUNTER — Ambulatory Visit: Payer: Medicare Other

## 2010-08-26 NOTE — Assessment & Plan Note (Signed)
Summary: DIZZY/CLE   Vital Signs:  Patient profile:   77 year old female Weight:      123.75 pounds Temp:     98.0 degrees F oral Pulse rate:   76 / minute Pulse (ortho):   70 / minute Pulse rhythm:   regular BP sitting:   110 / 70  (left arm) BP standing:   110 / 70 Cuff size:   regular  Vitals Entered By: Selena Batten Dance CMA Duncan Dull) (August 16, 2010 2:19 PM) CC: Dizzy (last PM) Comments took Meclizine and it helped   Serial Vital Signs/Assessments:  Time      Position  BP       Pulse  Resp  Temp     By 2:20 PM   Lying LA  120/60   72                    Kim Dance CMA (AAMA) 2:20 PM   Sitting   110/70   76                    Kim Dance CMA (AAMA) 2:20 PM   Standing  110/70   70                    Kim Dance CMA (AAMA)   History of Present Illness: CC: dizzy  last night got dizzy  walking across room.  then this morning dizzy as well as nausea when getting up from bed.  then when orthostatics done here, felt dizzy.  dizziness coming on with rapid head movements/position changes.  has meclizine at home, took 1/2 pill and helped.  dizzy described as "everything going around my head".  No LOC, presyncope, no lightheaded.  No chest pain, HA, palpitations, SOB.  vertigo flares in past, last 1 year ago.  Meclizine usually helps.    Current Medications (verified): 1)  Allopurinol 300 Mg  Tabs (Allopurinol) .... Take 1 Tablet By Mouth Once A Day 2)  Synthroid 50 Mcg  Tabs (Levothyroxine Sodium) .... Take 1 Tablet By Mouth Once A Day 3)  Metoprolol Succinate 25 Mg  Tb24 (Metoprolol Succinate) .... Take 1 Tablet By Mouth Once A Day 4)  Lisinopril-Hydrochlorothiazide 20-12.5 Mg Tabs (Lisinopril-Hydrochlorothiazide) .... Take One By Mouth Daily 5)  Simvastatin 20 Mg  Tabs (Simvastatin) .... Take 1 Tablet By Mouth Once A Day 6)  Warfarin Sodium 5 Mg  Tabs (Warfarin Sodium) .... As Directed 7)  Omeprazole 20 Mg  Cpdr (Omeprazole) .... Take 1 Tablet By Mouth Once A Day 8)  Eye-Vite Extra   Caps  (Multiple Vitamins-Minerals) .... Take 1 Capsule By Mouth Once A Day 9)  Zolpidem Tartrate 5 Mg  Tabs (Zolpidem Tartrate) .... Take 1/2 Tab By Mouth At Bedtime As Needed 10)  Meclizine Hcl 25 Mg Tabs (Meclizine Hcl) .... 1/2 To 1 By Mouth Two Times A Day As Needed Vertigo 11)  Diltiazem Hcl Cr 180 Mg Xr24h-Cap (Diltiazem Hcl) .... Take One Tablet Daily 12)  Arimidex 1 Mg Tabs (Anastrozole) .... Take One Tablet Daily 13)  Tramadol Hcl 50 Mg  Tabs (Tramadol Hcl) .Marland Kitchen.. 1 By Mouth 4 Times Daily As Needed Pain 14)  Zolpidem Tartrate 10 Mg Tabs (Zolpidem Tartrate) .... 1/2 To 1 Tab By Mouth At Bedtime  Allergies (verified): No Known Drug Allergies  Past History:  Past Medical History: Last updated: 06/10/2010 Left breast cancer, partial mastectomy Osteoarthritis GERD Hypertension Gout Peptic ulcer disease Anemia-iron deficiency Hyperlipidemia  Social History: Last updated: 06/05/2007 Occupation: Worked at a [employer] widowed Never Smoked Alcohol use-no Drug use-no Regular exercise-yes, walking 2 times per day 0.5 mile Diet: fruits and veggies, water  Review of Systems       per HPI  Physical Exam  General:  Well-developed,well-nourished,in no acute distress; alert,appropriate and cooperative throughout examination Head:  Normocephalic and atraumatic without obvious abnormalities. No apparent alopecia or balding. Eyes:  PERRLA, EOMI, no injection Ears:  EAC covered with dry cerumen, unable to visualize TMs Nose:  nares clear, some dryness Mouth:  Oral mucosa and oropharynx without lesions or exudates.  Teeth in good repair.  dentures in Neck:  no LAD.  no TM.  neck supple. Lungs:  Normal respiratory effort, chest expands symmetrically. Lungs are clear to auscultation, no crackles or wheezes. Heart:  Normal rate and regular rhythm. S1 and S2 normal without gallop, murmur, click, rub or other extra sounds. Pulses:  2+ rad pulses bilaterally, brisk cap refill Extremities:  no pedal  edema Neurologic:  CN 2 - 12 intact x 8.  sensation , strength intact.  station and gait intact.  slightly decreased FTN bilaterally.   dix hallpike - + vertigo L >R side.  some torsional/vertical nystagmus bilaterally, suppressed with fixation   Impression & Recommendations:  Problem # 1:  VERTIGO, POSITIONAL (ICD-386.11) orthostatics negative.  nonfocal neuro.  + dix hallpike. likely peripheral.   paroxysmal and positional.   treat wth meclizine, provided with modified epley handout to do at home.   advised if worsening with handout, to stop and call us.   discussed option of PT referral if not improving with time.  checked INR to ensure normal range given on coumadin.  Her updated medication list for this problem includes:    Meclizine Hcl 25 Mg Tabs (Meclizine hcl) .Marland Kitchen... 1/2 to 1 by mouth two times a day as needed vertigo  Complete Medication List: 1)  Allopurinol 300 Mg Tabs (Allopurinol) .... Take 1 tablet by mouth once a day 2)  Synthroid 50 Mcg Tabs (Levothyroxine sodium) .... Take 1 tablet by mouth once a day 3)  Metoprolol Succinate 25 Mg Tb24 (Metoprolol succinate) .... Take 1 tablet by mouth once a day 4)  Lisinopril-hydrochlorothiazide 20-12.5 Mg Tabs (Lisinopril-hydrochlorothiazide) .... Take one by mouth daily 5)  Simvastatin 20 Mg Tabs (Simvastatin) .... Take 1 tablet by mouth once a day 6)  Warfarin Sodium 5 Mg Tabs (Warfarin sodium) .... As directed 7)  Omeprazole 20 Mg Cpdr (Omeprazole) .... Take 1 tablet by mouth once a day 8)  Eye-vite Extra Caps (Multiple vitamins-minerals) .... Take 1 capsule by mouth once a day 9)  Zolpidem Tartrate 5 Mg Tabs (Zolpidem tartrate) .... Take 1/2 tab by mouth at bedtime as needed 10)  Meclizine Hcl 25 Mg Tabs (Meclizine hcl) .... 1/2 to 1 by mouth two times a day as needed vertigo 11)  Diltiazem Hcl Cr 180 Mg Xr24h-cap (Diltiazem hcl) .... Take one tablet daily 12)  Arimidex 1 Mg Tabs (Anastrozole) .... Take one tablet daily 13)   Tramadol Hcl 50 Mg Tabs (Tramadol hcl) .Marland Kitchen.. 1 by mouth 4 times daily as needed pain 14)  Zolpidem Tartrate 10 Mg Tabs (Zolpidem tartrate) .... 1/2 to 1 tab by mouth at bedtime  Other Orders: Protime (65784ON)  Patient Instructions: 1)  i think this is coming from the inner ear. 2)  continue meclizine for next few days to help with dizziness. 3)  Try these exercises to see if we can  improve vertigo.  if worsening, let us know or return sooner.   4)  we could send you to physical therapy if not improving.   Orders Added: 1)  Est. Patient Level I [59563] 2)  Protime [87564PP] 3)  Est. Patient Level III [29518]    Current Allergies (reviewed today): No known allergies   Laboratory Results   Blood Tests   Date/Time Recieved: August 16, 2010 2:47 PM  Date/Time Reported: August 16, 2010 2:47 PM   PT: 35.8 s   (Normal Range: 10.6-13.4)  INR: 3.0   (Normal Range: 0.88-1.12   Therap INR: 2.0-3.5)      ANTICOAGULATION RECORD PREVIOUS REGIMEN & LAB RESULTS Anticoagulation Diagnosis:  Atrial fibrillation on  06/17/2009 Previous INR Goal Range:  2.0-3.0 on  06/17/2009 Previous INR:  2.2 on  07/28/2010 Previous Coumadin Dose(mg):   2.5 mg daily, 5mg  tues, thurs on  07/28/2010 Previous Regimen:   2.5 mg daily, 5mg  tues, thurs on  07/28/2010 Previous Coagulation Comments:  . on  04/02/2010  NEW REGIMEN & LAB RESULTS Current INR: 3.0 Regimen:  2.5 mg daily, 5mg  tues, thurs  (no change)  Provider: [facility]      Repeat testing in: 4 weeks MEDICATIONS ALLOPURINOL 300 MG  TABS (ALLOPURINOL) Take 1 tablet by mouth once a day SYNTHROID 50 MCG  TABS (LEVOTHYROXINE SODIUM) Take 1 tablet by mouth once a day METOPROLOL SUCCINATE 25 MG  TB24 (METOPROLOL SUCCINATE) Take 1 tablet by mouth once a day LISINOPRIL-HYDROCHLOROTHIAZIDE 20-12.5 MG TABS (LISINOPRIL-HYDROCHLOROTHIAZIDE) take one by mouth daily SIMVASTATIN 20 MG  TABS (SIMVASTATIN) Take 1 tablet by mouth once a day WARFARIN SODIUM 5  MG  TABS (WARFARIN SODIUM) as directed OMEPRAZOLE 20 MG  CPDR (OMEPRAZOLE) Take 1 tablet by mouth once a day EYE-VITE EXTRA   CAPS (MULTIPLE VITAMINS-MINERALS) Take 1 capsule by mouth once a day ZOLPIDEM TARTRATE 5 MG  TABS (ZOLPIDEM TARTRATE) Take 1/2 tab by mouth at bedtime as needed MECLIZINE HCL 25 MG TABS (MECLIZINE HCL) 1/2 to 1 by mouth two times a day as needed vertigo DILTIAZEM HCL CR 180 MG XR24H-CAP (DILTIAZEM HCL) take one tablet daily ARIMIDEX 1 MG TABS (ANASTROZOLE) take one tablet daily TRAMADOL HCL 50 MG  TABS (TRAMADOL HCL) 1 by mouth 4 times daily as needed pain ZOLPIDEM TARTRATE 10 MG TABS (ZOLPIDEM TARTRATE) 1/2 to 1 tab by mouth at bedtime  Dose has been reviewed with patient or caretaker during this visit.  Reviewed by: Allison Quarry  Anticoagulation Visit Questionnaire      Coumadin dose missed/changed:  No      Abnormal Bleeding Symptoms:  No Any diet changes including alcohol intake, vegetables or greens since the last visit:  No Any illnesses or hospitalizations since the last visit:  No Any signs of clotting since the last visit (including chest discomfort, dizziness, shortness of breath, arm tingling, slurred speech, swelling or redness in leg):  Yes      Signs of Clotting:  vertigo, saw Dr Sharen Hones today

## 2010-08-26 NOTE — Progress Notes (Signed)
Summary: [name] [name]  Phone Note Refill Request Message from:  [employer] on August 16, 2010 3:36 PM  Refills Requested: Medication #1:  ZOLPIDEM TARTRATE 10 MG TABS 1/2 to 1 tab by mouth at bedtime.   Supply Requested: 1 month [facility] [address]   Method Requested: Telephone to Pharmacy Initial call taken by: Benny Lennert CMA Duncan Dull),  August 16, 2010 3:36 PM  Follow-up for Phone Call        Rx called to pharmacy Follow-up by: Benny Lennert CMA Duncan Dull),  August 16, 2010 3:42 PM    Prescriptions: ZOLPIDEM TARTRATE 5 MG  TABS (ZOLPIDEM TARTRATE) Take 1/2 tab by mouth at bedtime as needed  #30 x 1   Entered and Authorized by:   [name] MD   Signed by:   [name] MD on 08/16/2010   Method used:   Telephoned to ...       [facility] [address] [phone]* (retail)       [address]       [place], [place]  [address]       Ph: [phone]       Fax: [phone]   RxID:   [id number]

## 2010-08-31 ENCOUNTER — Telehealth: Payer: Self-pay | Admitting: Radiology

## 2010-08-31 DIAGNOSIS — I4891 Unspecified atrial fibrillation: Secondary | ICD-10-CM

## 2010-08-31 DIAGNOSIS — Z7901 Long term (current) use of anticoagulants: Secondary | ICD-10-CM

## 2010-08-31 DIAGNOSIS — Z5181 Encounter for therapeutic drug level monitoring: Secondary | ICD-10-CM

## 2010-08-31 NOTE — Telephone Encounter (Signed)
INR standing order, please sign and close the encounter  

## 2010-09-14 ENCOUNTER — Ambulatory Visit (INDEPENDENT_AMBULATORY_CARE_PROVIDER_SITE_OTHER): Payer: Medicare Other | Admitting: Family Medicine

## 2010-09-14 DIAGNOSIS — I4891 Unspecified atrial fibrillation: Secondary | ICD-10-CM

## 2010-09-14 DIAGNOSIS — Z7901 Long term (current) use of anticoagulants: Secondary | ICD-10-CM

## 2010-09-14 DIAGNOSIS — Z5181 Encounter for therapeutic drug level monitoring: Secondary | ICD-10-CM

## 2010-09-14 LAB — POCT INR: INR: 2.2

## 2010-09-16 ENCOUNTER — Ambulatory Visit: Payer: Medicare Other

## 2010-09-17 ENCOUNTER — Ambulatory Visit: Payer: Medicare Other | Admitting: Internal Medicine

## 2010-09-17 ENCOUNTER — Telehealth: Payer: Self-pay | Admitting: *Deleted

## 2010-09-17 NOTE — Telephone Encounter (Signed)
Heather, call the patient - I am not sure. That does not sound like anything classic. If it persists, she should probably follow-up in the office.   Dr. Ermalene Searing - do you recall if she is my patient or yours? It looks like you used to see her for a long time, and I can't see a transfer of care note. In case you remember.   Herbert Seta, can you ask the patient about who she had wanted her PCP to be? I noticed that she had primarily been seeing [facility], and we just need to clarify for our records.

## 2010-09-17 NOTE — Telephone Encounter (Signed)
Pt states that for the last 2 nights she has had quivering in her lips, cheeks and chin.  This wakes her up. Happens in any position that she lays in.  Doesn't happen during the day.  She is asking what could this be.  Please advise.

## 2010-09-17 NOTE — Telephone Encounter (Signed)
Patient has appt with dr Alphonsus Sias at 4;00

## 2010-09-23 ENCOUNTER — Ambulatory Visit (INDEPENDENT_AMBULATORY_CARE_PROVIDER_SITE_OTHER): Payer: Medicare Other | Admitting: Family Medicine

## 2010-09-23 DIAGNOSIS — I4891 Unspecified atrial fibrillation: Secondary | ICD-10-CM

## 2010-09-23 DIAGNOSIS — Z5181 Encounter for therapeutic drug level monitoring: Secondary | ICD-10-CM

## 2010-09-23 DIAGNOSIS — Z7901 Long term (current) use of anticoagulants: Secondary | ICD-10-CM

## 2010-10-12 ENCOUNTER — Ambulatory Visit (INDEPENDENT_AMBULATORY_CARE_PROVIDER_SITE_OTHER): Payer: Medicare Other | Admitting: Family Medicine

## 2010-10-12 DIAGNOSIS — Z5181 Encounter for therapeutic drug level monitoring: Secondary | ICD-10-CM

## 2010-10-12 DIAGNOSIS — Z7901 Long term (current) use of anticoagulants: Secondary | ICD-10-CM

## 2010-10-12 LAB — POCT INR: INR: 3

## 2010-10-12 NOTE — Assessment & Plan Note (Signed)
[facility]                            CARDIOLOGY OFFICE NOTE   NAME:[name] [name], [name] [name]                          MRN:          [id number]  DATE:01/01/2007                            DOB:          05-May-1924    IDENTIFICATION:  [name] [name] is a 77 year old woman who was previously  followed by Glennon Hamilton. She was last seen back in June, has a history  of paroxysmal atrial fibrillation, hypertension. She is currently on  Coumadin therapy.   Since seen she has done well. She denies shortness of breath, no  significant palpitations. No dizziness, breathing is ok. Last week had a  twitch in her chest on the left side but thought it was because she  pulled a muscle, did not last long.   CURRENT MEDICATIONS:  Include:  1. Diltiazem 180.  2. Allopurinol 300.  3. Synthroid 0.5.  4. Ambien one half at bedtime p.r.n.  5. Benicar 40.  6. Metoprolol 25.  7. Hydrochlorothiazide 12.5.  8. Simvastatin 20.  9. Omeprazole 20.  10.Warfarin 5.  11.Aspirin 81.   PHYSICAL EXAMINATION:  Patient is in no distress. Blood pressure 143/79,  at home she says it is 102 to 139 systolic, pulse is 97 and regular,  weight 129 down from 132.  LUNGS: Clear.  CARDIAC EXAM: Regular rate and rhythm. S1, S2. No S3. No significant  murmurs.  ABDOMEN: Benign. No masses.  EXTREMITIES: No edema.   IMPRESSION:  1. Intermittent atrial fibrillation currently in sinus. Would continue      on current regimen but we will need to review her chart about the      aspirin.  2. Hypertension, good control.  3. Health care maintenance, abdominal ultrasound without dilation of      the aorta.   I will set follow up for 6 months sooner if problems develop. I will be  in touch with the patient after I reviewed things. We will get a lipid  panel as well at her convenience. Follow up January 2009.     Pricilla Riffle, MD, [facility]  Electronically Signed    PVR/MedQ  DD: 01/01/2007  DT: 01/01/2007   Job #: [id number]

## 2010-10-12 NOTE — Assessment & Plan Note (Signed)
[facility]                            CARDIOLOGY OFFICE NOTE   NAME:[name] [name] WENTZ                          MRN:          [id number]  DATE:11/12/2007                            DOB:          06/10/1923    IDENTIFICATION:  The patient is an 77 year old woman with a history of  intermittent atrial fibrillation, hypertension, and dyslipidemia.  I  last saw her in August.   In the interval, she has done well.  She is now followed by Dr. Ermalene Searing  at [facility].  Her hydrochlorothiazide was stopped.  She could take  as needed.  She has noticed her blood pressure is higher since.   She denies palpitations.  No chest pain, no dizziness, and no shortness  of breath.   CURRENT MEDICATIONS:  1. Diltiazem 180.  2. Allopurinol 300.  3. Synthroid 50 mcg.  4. Metoprolol succinate 25 mg daily.  5. Benicar 40.  6. Simvastatin 20.  7. Coumadin as directed.  8. Omeprazole 20.  9. Eye-Vite.  10.__________.  11.Zolpidem tartrate.   PHYSICAL EXAMINATION:  The patient in no distress.  Blood pressure is  157/77, pulse is 72 and regular, and weight 123, down 6 pounds from  previous.  LUNGS:  Clear.  CARDIAC:  Regular rate and rhythm.  S1 and S2.  No S3.  No murmurs.  NECK:  No bruits.  JVP is normal.  ABDOMEN:  Benign.  EXTREMITIES:  No edema.   A 12-lead EKG shows sinus rhythm with PACs, rate of 72 beats per minute,  and nonspecific ST changes.   IMPRESSION:  1. Paroxysmal atrial fibrillation.  Would continue on Coumadin.  She      has not had any symptoms.  2. Hypertension.  Blood pressure is higher.  I received a letter from      the insurance company about her being on hydrochlorothiazide with      gout.  She notes a long history of gout, on allopurinol.      Hydrochlorothiazide was added about a year ago for blood pressure;      she has not had any development of symptoms.  It did control her      blood pressure well.  I would go ahead and re-add it.   She will      follow up with Dr. Ermalene Searing.  3. Dyslipidemia.  Labs pending per Dr. Ermalene Searing.   I will set followup for 10 months or sooner if problems develop.     Pricilla Riffle, MD, [facility]  Electronically Signed    PVR/MedQ  DD: 11/12/2007  DT: 11/13/2007  Job #: [id number]   cc:   [name] Nora, MD

## 2010-10-12 NOTE — Letter (Signed)
November 02, 2006    Maryelizabeth Rowan, M.D.  [address]  [place], [place] [address]   RE:  [name] [name], [name] [name]  MRN:  [id number]  /  DOB:  08-Jun-1923   Dear Dr. Duanne Guess:   It was a please to see your nice patient, [name] [name], for followup  concerning paroxysmal atrial fibrillation and hypertension.  I first saw  her on August 29, 2006.  At that time she had a differential blood  pressure 145/80 right arm and 130/80 left arm, I could not hear a bruit  over the left subclavian.  She also had a history of iron deficiency,  though not anemic.  She had GI evaluation with endoscopy and  colonoscopy.  She was found to have a hiatal hernia with some erosions  and erosive antral gastritis and gastric ulcer.  It was felt that she  was okay to be started on Coumadin.   She also had colonoscopy which revealed two small polyps which were  removed.   The patient is feeling quite well, some discomfort in the left leg but  no weakness.  She wonders if this is due to Metoprolol.   MEDICATIONS:  1. Toprol XL 25.  2. Benicar 40.  3. Synthroid 5.  4. Allopurinol.  5. Omeprazole 20.  6. Simvastatin 20.  7. Hydrochlorothiazide 12.5.  8. Diltiazem 180.   PHYSICAL EXAMINATION:  VITAL SIGNS:  Blood pressure 133/79, pulse 98  normal sinus rhythm.  GENERAL APPEARANCE:  Stable.  NECK:  JVP is elevated.  Carotid pulses bilaterally without bruits.  LUNGS:  Clear.  CARDIAC:  Normal.  ABDOMEN:  Reveals no palpation of bruits.  She does give a history of  occasional indigestion.  EXTREMITIES:  Normal.   EKG reveals normal sinus rhythm, first degree AV block, rate of 90.   As previously discussed, we plan to start her on Coumadin per our  Coumadin Clinic, although we will actually put her in the Rocket Study  comparing Coumadin to a study drug.   In addition, we plan to abdominal ultrasound.   I will have her follow up with Dr. Tenny Craw in 6-8 weeks p.r.n.  We have  discussed the risk of anticoagulation with  the patient.   Thank you for the opportunity to share in the care of this nice patient.    Sincerely,      E. Graceann Congress, MD, [facility]  Electronically Signed    EJL/MedQ  DD: 11/02/2006  DT: 11/02/2006  Job #: [id number]

## 2010-10-13 ENCOUNTER — Encounter (INDEPENDENT_AMBULATORY_CARE_PROVIDER_SITE_OTHER): Payer: Self-pay | Admitting: General Surgery

## 2010-10-15 NOTE — Assessment & Plan Note (Signed)
[facility]ALTHC[facility]                                 ON-CALL NOTE   NAME:[name] [name], [name] [name]                            MRN:          [id number]  DATE:08/26/2006                            DOB:          31-Mar-1924    PHONE NUMBER:  [phone]   [name] [name] was seen by her primary physician, Dr. Maryelizabeth Rowan for an  elective visit on August 26, 2006.  It turned out that the patient had  atrial fibrillation and Dr. Duanne Guess sent her to [facility] where she was  seen by Dr. Ethelda Chick.  Dr. Ethelda Chick noted that she did have atrial  fib, but that she converted to sinus while in the emergency room.  Some  basic labs were checked and they were stable, but I do not have them at  this time.  The patient was quite stable and was asking to go home.  Dr.  Duanne Guess had mentioned that the patient had seen [place] in some capacity in the  past, but I could not find any computer records of this.  The patient  did have an echo that had been read by Dr. Jens Som in the past.  It is  of note that there was mention in his report that there may have been  some thickening of the right ventricular wall, and that further  evaluation might be considered if clinically indicated.  I do not have  any further information concerning the patient evaluation.   The patient was quite stable and we decided on the phone that she could  be discharged home by Dr. Ethelda Chick.  I let him know that our office  would call to make an appointment for the patient to be seen in our  office within the next 2 to 3 days to evaluate her overall status.     Luis Abed, MD, [facility]  Electronically Signed    JDK/MedQ  DD: 08/26/2006  DT: 08/26/2006  Job #: [id number]

## 2010-10-15 NOTE — Letter (Signed)
September 20, 2006    Maryelizabeth Rowan, M.D.  [address]  [place], [place] [address]   RE:  LEXIANNA, [name] [name]  MRN:  [id number]  /  DOB:  Mar 29, 1924   Dear Dr. Duanne Guess,   It was a pleasure to see your nice patient, Saphira Lahmann, for followup on  September 20, 2006. As you know, I initially saw her on April 1 at which  time she had a recent diagnosis of paroxysmal atrial fibrillation. She  also has a history of hypertension with some differential in her right  and left arm, iron deficiency anemia, hypothyroidism on therapy, some  minor ST changes on EKG.   She underwent a 2-D echo which revealed a normal LV function with an EF  of 65% with no wall motion abnormality. There was some mild increase in  thickening of the aortic valve and mild aortic regurgitation. The mean  valve gradient was 9.   He had a stress echo that revealed neither ischemia or a scar.   The Holter monitor revealed frequent PVCs with short runs of either  multifocal atrial tach or possibly paroxysmal atrial fibrillation, this  is for only a few seconds at a time. There were also some PVCs. The  patient has not been symptomatic.   She is scheduled to have a colonoscopy later this month per Dr. Leone Payor.   MEDICATIONS:  1. Toprol XL 25.  2. Diltiazem 180 b.i.d.  3. Benicar 40.  4. Synthroid 5.  5. Allopurinol 30.   PHYSICAL EXAMINATION:  VITAL SIGNS:  Blood pressure 165/80 in the right  arm, 150/70 in the left arm. Short bruit just below the left clavicle.  LUNGS:  Clear.  CARDIAC:  Reveals no murmur.  ABDOMEN:  Unremarkable.  EXTREMITIES:  Normal.   IMPRESSION:  Diagnoses as above.   PLAN:  In regards to the persistent hypertension, I plan to start her on  hydrochlorothiazide 12.5, as for her elevated lipids will start her on  Simvastatin 20.   I will see her back in 3-4 weeks following her colonoscopy and add  Coumadin hopefully.   Thank you for the opportunity to share in this nice patient's care.    Sincerely,      E. Graceann Congress, MD, [facility]  Electronically Signed    EJL/MedQ  DD: 09/20/2006  DT: 09/20/2006  Job #: [id number]

## 2010-10-15 NOTE — Letter (Signed)
August 29, 2006    Maryelizabeth Rowan, M.D.  [facility]  [address]  [place], [place] [address]   RE:  [name], [name]  MRN:  [id number]  /  DOB:  1924-01-12   Dear Dr. Duanne Guess:   I appreciate the opportunity of evaluating your nice patient, [name] [name],  who has recent diagnosis of paroxysmal atrial fibrillation.   As you know, the patient has had history of hypertension, gout, and  thyroid disease in the past.  Apparently, last week when she went in for  a followup office visit with you she was noted to be in atrial  fibrillation.  She was sent to the emergency room where she converted  spontaneously.  Laboratory data at that time was unremarkable revealing  normal TSH, normal BNP, and normal CBC.  Cardiac markers were normal.  CBC was normal.  EKG revealed atrial fibrillation.  The patient has been  asymptomatic with no chest pain, shortness of breath, palpitations, or  dizziness.   Her past history is quite unremarkable.  She has never had surgery.  No  hospitalizations, and never been seriously ill.   ALLERGIES:  NONE.   She has never smoked.  No ethanol.   MEDICATIONS:  1. Diltiazem 180 b.i.d.  2. Allopurinol 300 daily.  3. Synthroid 5 mg daily.  4. Ambien 1/2 tablet daily.  5. Benicar 40.   REVIEW OF SYSTEMS:  Head, ears, eyes, and nose are unremarkable.  She  does wear glasses for reading.  She has dentures.  CARDIOVASCULAR:  As noted above.  She does have history of murmur since  childhood.  She has had hypertension.  GI:  She has history of low iron.  She was scheduled to have GI  evaluation today, but cancelled it in order to come here.  Apparently,  she is to be set up for endoscopy.  GU HISTORY:  Negative.  GYNECOLOGIC HISTORY:  Five children.  ENDOCRINE HISTORY:  Unremarkable.  Weight has been stable.  Allergies.  Rhinitis.   SOCIAL HISTORY:  She is retired.  She walks.  She is widowed.  Has 5  children.   FAMILY HISTORY:   Noncontributory.   PHYSICAL EXAMINATION:  Blood pressure is 132/76 right arm, 118/78 left  arm.  A repeat was 145/80 right arm, 130/80 left arm.  GENERAL APPEARANCE:  Normal.  JVP is not elevated.  Carotid pulses are palpable, equal without bruits.  There was no bruit over the left subclavian.  Actually, the pulses felt  really similar in the right and left brachial areas.  LUNGS:  Clear.  CARDIAC EXAM:  Normal.  I did not hear a murmur.  ABDOMINAL EXAM:  Normal.  Liver, spleen, and kidneys not palpable.  EXTREMITIES:  No edema.  Pulses are palpable and equal bilaterally.  NEUROLOGIC:  Unremarkable.  EKG reveals normal sinus rhythm with nonspecific ST-T changes.   IMPRESSION:  1. Hypertension with blood pressure differential between right and      left arm suggesting possible subclavian stenosis, but I could not      hear a bruit.  2. Possible atrial fibrillation presently in normal sinus rhythm.  3. History of iron deficiency, though she is not anemic.  4. History of hypothyroidism, on therapy.  5. She does have minor ST changes on EKG.   I have suggested followup 2D echo, though she had one a year ago that  revealed a normal LV.  In addition, she has had adenosine Myoview.  She  was started on Toprol XL 25 with a Holter monitor to be done a week  later.   She should also be on Coumadin.  We will hold the Coumadin until she has  GI evaluation.  Hopefully this can be done within the next week.  I will  see her back in 2 to 3 weeks or p.r.n.  Thank you for the opportunity to  share in this nice patient's care.    Sincerely,      E. Graceann Congress, MD, [facility]  Electronically Signed    EJL/MedQ  DD: 08/29/2006  DT: 08/29/2006  Job #: [id number]

## 2010-10-15 NOTE — Assessment & Plan Note (Signed)
[facility]                         GASTROENTEROLOGY OFFICE NOTE   TERENA, BOHAN                          MRN:          [id number]  DATE:09/05/2006                            DOB:          04-Jul-1923    REFERRING PHYSICIAN:  Maryelizabeth Rowan, M.D.   REASON FOR CONSULTATION:  Microcytic anemia/iron deficiency anemia.   ASSESSMENT:  Iron deficiency anemia, Hemoccult negative. Etiology not  clear. She does eat meat. Chronic occult blood loss anemia is still  possible.   RECOMMENDATIONS AND PLAN:  Schedule colonoscopy to investigate. She has  never had a colonoscopy. The risks, benefits and indications are  explained. She understands and agrees to proceed. A MiraLax prep will be  used. She was quite apprehensive about drinking for the prep but I asked  her to at least try this and she agreed to do so.   If that is unrevealing, an upper endoscopy could be indicated. The  patient is in the process of a workup for possible initiation of  Coumadin for recent problems with paroxysmal atrial fibrillation.   HISTORY:  This 77 year old white woman was seen at [facility] and she has been followed for a microcytic anemia. In February,  her hemoglobin was 7.5 with an MCV of 72. She was started on iron and  hemoglobin has risen to 12.1, the hematocrit 38, MCV up to 82. She has  not had any melena though she has had dark stools on her iron. She  recently did Hemoccults that were negative for blood, these were the  pads that were dispensed into the toilet. Her GI review of systems is  otherwise completely unremarkable without dysphagia, weight loss,  appetite problems, abdominal pain or change in bowel habits. I should  say she has had some loose stools since initiating iron.   PAST MEDICAL HISTORY:  1. Hypertension.  2. Tubal ligation.  3. Appendectomy.  4. Cesarean section x4.  5. Dyslipidemia.  6. Paroxysmal atrial fibrillation with  rapid ventricular response      recently evaluated. The patient converted to normal sinus rhythm      spontaneously. Cardiac markers were normal. A TSH and a CBC were      apparently normal at that time as well. Review of those records      shows that her hemoglobin was normal at 12.1. A portable chest      showing cardiomegaly and mild basilar atelectasis.  7. Gout.   FAMILY HISTORY:  No colon cancer reported. Her mother apparently had  colon polyps. That review is otherwise negative.   SOCIAL HISTORY:  She is widowed, she lives alone, 4 sons, 2 daughters.  No alcohol, tobacco or drugs.   REVIEW OF SYSTEMS:  See medical history form for full details. She is  not having any chest pain, dyspnea or cardiac symptoms at this time.   PHYSICAL EXAMINATION:  GENERAL:  Reveals a pleasant, petite, elderly  white woman.  VITAL SIGNS:  Height 5 foot 2, weight 134 pounds. Blood pressure 122/64,  pulse 80.  EYES:  Anicteric.  ENT:  Shows dentures otherwise free of lesions in the mouth and  posterior pharynx.  NECK:  Supple, no thyromegaly.  CHEST:  Clear.  HEART:  S1, S2. No murmurs, rubs or gallops.  ABDOMEN:  Soft, nontender without organomegaly or mass.  RECTAL:  Deferred.  EXTREMITIES:  No edema.  SKIN:  No rash.  NEUROLOGIC:  She is alert and oriented x3.   I appreciate the opportunity to care for this patient.     Iva Boop, MD,FACG  Electronically Signed    CEG/MedQ  DD: 09/05/2006  DT: 09/05/2006  Job #: [id number]   cc:   Maryelizabeth Rowan, M.D.  Cecil Cranker, MD, [facility]

## 2010-11-09 ENCOUNTER — Ambulatory Visit: Payer: Medicare Other

## 2010-11-11 ENCOUNTER — Ambulatory Visit (INDEPENDENT_AMBULATORY_CARE_PROVIDER_SITE_OTHER): Payer: Medicare Other | Admitting: Family Medicine

## 2010-11-11 ENCOUNTER — Institutional Professional Consult (permissible substitution): Payer: Medicare Other | Admitting: Family Medicine

## 2010-11-11 ENCOUNTER — Encounter: Payer: Self-pay | Admitting: Family Medicine

## 2010-11-11 VITALS — BP 130/82 | HR 72 | Temp 97.6°F | Ht 63.5 in | Wt 124.0 lb

## 2010-11-11 DIAGNOSIS — I4891 Unspecified atrial fibrillation: Secondary | ICD-10-CM

## 2010-11-11 DIAGNOSIS — Z5181 Encounter for therapeutic drug level monitoring: Secondary | ICD-10-CM

## 2010-11-11 DIAGNOSIS — M109 Gout, unspecified: Secondary | ICD-10-CM

## 2010-11-11 DIAGNOSIS — H3530 Unspecified macular degeneration: Secondary | ICD-10-CM

## 2010-11-11 DIAGNOSIS — E039 Hypothyroidism, unspecified: Secondary | ICD-10-CM

## 2010-11-11 DIAGNOSIS — Z7901 Long term (current) use of anticoagulants: Secondary | ICD-10-CM

## 2010-11-11 LAB — URIC ACID: Uric Acid, Serum: 3.3 mg/dL (ref 2.4–7.0)

## 2010-11-11 LAB — TSH: TSH: 1.34 u[IU]/mL (ref 0.35–5.50)

## 2010-11-11 NOTE — Progress Notes (Signed)
[name] [name], a 77 y.o. female presents today in the office for the following:    F/u for medical eval for DMV, review of all medical probs, completion of forms. Macular deg - Optho to send in separate eval.   Hypothyroid, stable on current synthroid  Gout, doing much better currently on Allopurinol  A Fib, on Coumadin, rate controlled on Dilt and Toprol  Patient Active Problem List  Diagnoses  . INFILTRATING DUCTAL CARCINOMA, LEFT BREAST  . HYPOTHYROIDISM  . HYPERLIPIDEMIA  . GOUT  . ANEMIA-IRON DEFICIENCY  . INSOMNIA, CHRONIC  . HYPERTENSION  . PAROXYSMAL ATRIAL FIBRILLATION  . GERD  . PEPTIC ULCER DISEASE  . OSTEOARTHRITIS  . POSTMENOPAUSAL OSTEOPOROSIS  . VERTIGO, POSITIONAL  . Macular degeneration   Past Medical History  Diagnosis Date  . Hypertension   . Heart murmur   . Gout   . Hypothyroidism   . Afib   . Muscular degeneration   . Breast cancer, left breast    Past Surgical History  Procedure Date  . Cesarean section   . Appendectomy   . Breast surgery     left   History  Substance Use Topics  . Smoking status: Unknown If Ever Smoked  . Smokeless tobacco: Not on file  . Alcohol Use: No   Family History  Problem Relation Age of Onset  . Cancer Sister    No Known Allergies Current Outpatient Prescriptions on File Prior to Visit  Medication Sig Dispense Refill  . allopurinol (ZYLOPRIM) 300 MG tablet Take 300 mg by mouth daily.        Marland Kitchen diltiazem (CARDIZEM CD) 180 MG 24 hr capsule Take 180 mg by mouth daily.        Marland Kitchen levothyroxine (SYNTHROID, LEVOTHROID) 50 MCG tablet Take 50 mcg by mouth daily.        Marland Kitchen lisinopril-hydrochlorothiazide (PRINZIDE,ZESTORETIC) 20-12.5 MG per tablet Take 1 tablet by mouth daily.        . metoprolol succinate (TOPROL-XL) 25 MG 24 hr tablet Take 25 mg by mouth daily.        . Multiple Vitamins-Minerals (EYE-VITE EXTRA PO) Take by mouth.        Marland Kitchen omeprazole (PRILOSEC) 20 MG capsule Take 20 mg by mouth daily.        .  simvastatin (ZOCOR) 20 MG tablet Take 20 mg by mouth at bedtime.        Marland Kitchen warfarin (COUMADIN) 5 MG tablet Take 5 mg by mouth daily.        . Zolpidem Tartrate 5 MG SUBL Place under the tongue as needed.         ROS: GEN: No acute illnesses, no fevers, chills. GI: No n/v/d, eating normally Pulm: No SOB Interactive and getting along well at home.  Otherwise, ROS is as per the HPI.   Physical Exam  Blood pressure 130/82, pulse 72, temperature 97.6 F (36.4 C), temperature source Oral, height 5' 3.5" (1.613 m), weight 124 lb (56.246 kg), SpO2 98.00%.  GEN: WDWN, NAD, Non-toxic, A & O x 3 HEENT: Atraumatic, Normocephalic. Neck supple. No masses, No LAD. Ears and Nose: No external deformity. CV: RRR, No M/G/R. No JVD. No thrill. No extra heart sounds. PULM: CTA B, no wheezes, crackles, rhonchi. No retractions. No resp. distress. No accessory muscle use. EXTR: No c/c/e NEURO Normal gait.  PSYCH: Normally interactive. Conversant. Not depressed or anxious appearing.  Calm demeanor.

## 2010-11-11 NOTE — Patient Instructions (Signed)
No change, 2.5 mg daily, 5mg  Tues, Thurs, recheck 4 weeks

## 2010-12-02 ENCOUNTER — Other Ambulatory Visit: Payer: Self-pay | Admitting: Family Medicine

## 2010-12-09 ENCOUNTER — Ambulatory Visit (INDEPENDENT_AMBULATORY_CARE_PROVIDER_SITE_OTHER): Payer: Medicare Other | Admitting: Family Medicine

## 2010-12-09 DIAGNOSIS — Z7901 Long term (current) use of anticoagulants: Secondary | ICD-10-CM

## 2010-12-09 DIAGNOSIS — Z5181 Encounter for therapeutic drug level monitoring: Secondary | ICD-10-CM

## 2010-12-09 DIAGNOSIS — I4891 Unspecified atrial fibrillation: Secondary | ICD-10-CM

## 2010-12-09 NOTE — Patient Instructions (Signed)
Continue current dose, check in 4 weeks  

## 2010-12-13 ENCOUNTER — Encounter: Payer: Self-pay | Admitting: Family Medicine

## 2010-12-15 ENCOUNTER — Other Ambulatory Visit: Payer: Self-pay | Admitting: Family Medicine

## 2010-12-16 NOTE — Telephone Encounter (Signed)
Ok to refill, #30, 5 refills

## 2010-12-16 NOTE — Telephone Encounter (Signed)
rx called to pharmacy ([facility])

## 2010-12-23 ENCOUNTER — Other Ambulatory Visit: Payer: Self-pay | Admitting: Family Medicine

## 2010-12-23 ENCOUNTER — Telehealth: Payer: Self-pay | Admitting: *Deleted

## 2010-12-23 DIAGNOSIS — Z9889 Other specified postprocedural states: Secondary | ICD-10-CM

## 2010-12-23 DIAGNOSIS — Z853 Personal history of malignant neoplasm of breast: Secondary | ICD-10-CM

## 2010-12-23 DIAGNOSIS — M8580 Other specified disorders of bone density and structure, unspecified site: Secondary | ICD-10-CM

## 2010-12-23 NOTE — Telephone Encounter (Signed)
Pt needs order for dexa, she gets this done at [facility].  She wants this done on 8/23 at 9:30, that is when she is getting her mammogram

## 2010-12-25 DIAGNOSIS — M8580 Other specified disorders of bone density and structure, unspecified site: Secondary | ICD-10-CM | POA: Insufficient documentation

## 2010-12-25 NOTE — Telephone Encounter (Signed)
done

## 2010-12-27 ENCOUNTER — Other Ambulatory Visit: Payer: Self-pay | Admitting: Family Medicine

## 2011-01-03 ENCOUNTER — Telehealth: Payer: Self-pay | Admitting: *Deleted

## 2011-01-03 NOTE — Telephone Encounter (Signed)
Patient advised and put up front for pick up 

## 2011-01-03 NOTE — Telephone Encounter (Signed)
Can you bring me the handicap permanent  Pt info on and i can sign  thanks

## 2011-01-03 NOTE — Telephone Encounter (Signed)
Patient is asking if she could get a second handicap sticker?  She does not drive anymore and has 2 different people that take her where she needs to go and she would like to keep one in both vehicles.

## 2011-01-05 ENCOUNTER — Ambulatory Visit (INDEPENDENT_AMBULATORY_CARE_PROVIDER_SITE_OTHER): Payer: Medicare Other | Admitting: Family Medicine

## 2011-01-05 DIAGNOSIS — Z5181 Encounter for therapeutic drug level monitoring: Secondary | ICD-10-CM

## 2011-01-05 DIAGNOSIS — Z7901 Long term (current) use of anticoagulants: Secondary | ICD-10-CM

## 2011-01-05 DIAGNOSIS — I4891 Unspecified atrial fibrillation: Secondary | ICD-10-CM

## 2011-01-05 LAB — POCT INR: INR: 1.8

## 2011-01-05 NOTE — Patient Instructions (Signed)
Continue current dose, check in 4 weeks  

## 2011-01-06 ENCOUNTER — Ambulatory Visit: Payer: Medicare Other

## 2011-01-19 ENCOUNTER — Encounter (INDEPENDENT_AMBULATORY_CARE_PROVIDER_SITE_OTHER): Payer: Self-pay | Admitting: General Surgery

## 2011-01-20 ENCOUNTER — Ambulatory Visit
Admission: RE | Admit: 2011-01-20 | Discharge: 2011-01-20 | Disposition: A | Discharge status: Home / Self Care | Payer: Medicare Other | Source: Ambulatory Visit | Attending: Family Medicine | Admitting: Family Medicine

## 2011-01-20 ENCOUNTER — Ambulatory Visit: Payer: Self-pay | Admitting: Family Medicine

## 2011-01-20 ENCOUNTER — Encounter: Payer: Self-pay | Admitting: *Deleted

## 2011-01-20 DIAGNOSIS — Z9889 Other specified postprocedural states: Secondary | ICD-10-CM

## 2011-01-20 DIAGNOSIS — Z853 Personal history of malignant neoplasm of breast: Secondary | ICD-10-CM

## 2011-01-24 ENCOUNTER — Ambulatory Visit (INDEPENDENT_AMBULATORY_CARE_PROVIDER_SITE_OTHER): Payer: Medicare Other | Admitting: Internal Medicine

## 2011-01-24 ENCOUNTER — Encounter: Payer: Self-pay | Admitting: Internal Medicine

## 2011-01-24 VITALS — BP 141/59 | HR 70 | Ht 63.0 in | Wt 122.0 lb

## 2011-01-24 DIAGNOSIS — R259 Unspecified abnormal involuntary movements: Secondary | ICD-10-CM

## 2011-01-24 DIAGNOSIS — R251 Tremor, unspecified: Secondary | ICD-10-CM | POA: Insufficient documentation

## 2011-01-24 NOTE — Assessment & Plan Note (Signed)
Recurrent but brief More internal it seems than clinically apparent No tachycardia---heart is regular now Nothing to suggest Parkinson's Does relate this occasionally over the years----?benign tremor (no family history)  P: recheck thyroid function      Reassured---doesn't need the ER even if it recurs

## 2011-01-24 NOTE — Progress Notes (Signed)
Subjective:    Patient ID: [name] [name], female    DOB: June 30, 1923, 77 y.o.   MRN: [id number]  HPI Having tremors--"like muscle spasm" ER visit in March or so --eval and head CT negative No problems since then, till last night Lips were quivering Brief periods where she "shakes"  No palpitations No chest pain  No weakness No falls No change in handwriting No change in walking Chronic feeling of stiffness---relates to ongoing arthritis  Does note that she gets her nerves acting up at times Does think that is what may be causing her symptoms  Current Outpatient Prescriptions on File Prior to Visit  Medication Sig Dispense Refill  . allopurinol (ZYLOPRIM) 300 MG tablet Take 300 mg by mouth daily.        Marland Kitchen anastrozole (ARIMIDEX) 1 MG tablet Take 1 mg by mouth daily.       Marland Kitchen diltiazem (CARDIZEM CD) 180 MG 24 hr capsule Take 180 mg by mouth daily.        Marland Kitchen levothyroxine (SYNTHROID, LEVOTHROID) 50 MCG tablet Take 50 mcg by mouth daily.        Marland Kitchen lisinopril-hydrochlorothiazide (PRINZIDE,ZESTORETIC) 20-12.5 MG per tablet TAKE ONE TABLET BY MOUTH EVERY DAY  30 tablet  6  . metoprolol succinate (TOPROL-XL) 25 MG 24 hr tablet Take 25 mg by mouth daily.        . Multiple Vitamins-Minerals (EYE-VITE EXTRA PO) Take by mouth.        Marland Kitchen omeprazole (PRILOSEC) 20 MG capsule Take 20 mg by mouth daily.        . simvastatin (ZOCOR) 20 MG tablet Take 20 mg by mouth at bedtime.        Marland Kitchen warfarin (COUMADIN) 5 MG tablet TAKE AS DIRECTED  30 tablet  12  . zolpidem (AMBIEN) 5 MG tablet TAKE ONE-HALF TABLET BY MOUTH AT BEDTIME AS NEEDED  30 tablet  5    No Known Allergies  Past Medical History  Diagnosis Date  . Hypertension   . Heart murmur   . Gout   . Hypothyroidism   . Afib   . Muscular degeneration   . Breast cancer, left breast   . Arthritis   . Peptic ulcer disease   . Anemia   . Hyperlipidemia     Past Surgical History  Procedure Date  . Cesarean section   . Appendectomy   . Breast  surgery     left  . Upper gastrointestinal endoscopy     Family History  Problem Relation Age of Onset  . Cancer Sister     History   Social History  . Marital Status: Widowed    Spouse Name: N/A    Number of Children: N/A  . Years of Education: N/A   Occupational History  . retired [employer]    Social History Main Topics  . Smoking status: Never Smoker   . Smokeless tobacco: Never Used  . Alcohol Use: No  . Drug Use: No  . Sexually Active: Not on file   Other Topics Concern  . Not on file   Social History Narrative  . No narrative on file   Review of Systems Appetite is down but eats Weight is stable Sleeps fair with sleeping pill    Objective:   Physical Exam  Constitutional: She appears well-developed and well-nourished. No distress.  Neck: Normal range of motion. Neck supple. No thyromegaly present.  Cardiovascular: Normal rate, regular rhythm and normal heart sounds.  Exam reveals no gallop.  No murmur heard. Pulmonary/Chest: Effort normal and breath sounds normal. No respiratory distress. She has no wheezes. She has no rales.  Musculoskeletal: Normal range of motion. She exhibits no edema.  Lymphadenopathy:    She has no cervical adenopathy.  Neurological: She is alert. She has normal strength. She displays tremor. No cranial nerve deficit. She exhibits normal muscle tone.       Very slight fine tremor in hands at rest Finger to nose is pretty normal Mild decreased strength extension at right knee (she relates to arthritis in hip and knee)  Psychiatric: She has a normal mood and affect. Her behavior is normal.       Mildly anxious           Assessment & Plan:

## 2011-01-24 NOTE — Patient Instructions (Signed)
Your tremor does not seem to be something serious. Please call the office if you notice it really getting worse. It doesn't seem like you need to go to the emergency room when it comes on

## 2011-01-25 LAB — TSH: TSH: 0.67 u[IU]/mL (ref 0.35–5.50)

## 2011-01-25 LAB — T4, FREE: Free T4: 1.16 ng/dL (ref 0.60–1.60)

## 2011-02-10 ENCOUNTER — Telehealth: Payer: Self-pay | Admitting: *Deleted

## 2011-02-10 ENCOUNTER — Ambulatory Visit (INDEPENDENT_AMBULATORY_CARE_PROVIDER_SITE_OTHER): Payer: Medicare Other | Admitting: Family Medicine

## 2011-02-10 DIAGNOSIS — Z7901 Long term (current) use of anticoagulants: Secondary | ICD-10-CM

## 2011-02-10 DIAGNOSIS — Z5181 Encounter for therapeutic drug level monitoring: Secondary | ICD-10-CM

## 2011-02-10 DIAGNOSIS — I4891 Unspecified atrial fibrillation: Secondary | ICD-10-CM

## 2011-02-10 DIAGNOSIS — Z13820 Encounter for screening for osteoporosis: Secondary | ICD-10-CM

## 2011-02-10 DIAGNOSIS — M8580 Other specified disorders of bone density and structure, unspecified site: Secondary | ICD-10-CM

## 2011-02-10 NOTE — Patient Instructions (Signed)
Hold today's dose then resume 2.5 mg daily, 5mg  Tues, thurs, recheck 2 weeks

## 2011-02-10 NOTE — Telephone Encounter (Signed)
Patient says that she needs order for [facility] Density test at the [facility] wants mornings and as soon as possible

## 2011-02-11 ENCOUNTER — Other Ambulatory Visit: Payer: Self-pay | Admitting: Oncology

## 2011-02-11 DIAGNOSIS — Z853 Personal history of malignant neoplasm of breast: Secondary | ICD-10-CM

## 2011-02-14 NOTE — Telephone Encounter (Signed)
I called the patiient to schedule the Bone Density and her cancer Dr Donnie Coffin put order in and got it scheduled. So now can you please cancel your order! Thanks, Shirlee Limerick

## 2011-02-14 NOTE — Telephone Encounter (Signed)
Done. Shirlee Limerick can you help?

## 2011-02-15 NOTE — Telephone Encounter (Signed)
Addended by: [name] on: 02/15/2011 01:17 PM   Modules accepted: Orders

## 2011-02-15 NOTE — Telephone Encounter (Signed)
done

## 2011-02-16 ENCOUNTER — Ambulatory Visit
Admission: RE | Admit: 2011-02-16 | Discharge: 2011-02-16 | Disposition: A | Discharge status: Home / Self Care | Payer: Medicare Other | Source: Ambulatory Visit | Attending: Oncology | Admitting: Oncology

## 2011-02-16 DIAGNOSIS — Z853 Personal history of malignant neoplasm of breast: Secondary | ICD-10-CM

## 2011-02-23 ENCOUNTER — Other Ambulatory Visit: Payer: Self-pay | Admitting: Family Medicine

## 2011-02-24 ENCOUNTER — Ambulatory Visit: Payer: Medicare Other

## 2011-03-01 ENCOUNTER — Ambulatory Visit (INDEPENDENT_AMBULATORY_CARE_PROVIDER_SITE_OTHER): Payer: Medicare Other | Admitting: Family Medicine

## 2011-03-01 DIAGNOSIS — Z5181 Encounter for therapeutic drug level monitoring: Secondary | ICD-10-CM

## 2011-03-01 DIAGNOSIS — Z7901 Long term (current) use of anticoagulants: Secondary | ICD-10-CM

## 2011-03-01 DIAGNOSIS — I4891 Unspecified atrial fibrillation: Secondary | ICD-10-CM

## 2011-03-01 LAB — POCT INR: INR: 4.7

## 2011-03-01 NOTE — Patient Instructions (Signed)
Hold x 2 days,then 2,5mg  daily (reduced by 5 mg) check in 6 days

## 2011-03-03 ENCOUNTER — Ambulatory Visit: Payer: Medicare Other

## 2011-03-07 ENCOUNTER — Ambulatory Visit (INDEPENDENT_AMBULATORY_CARE_PROVIDER_SITE_OTHER): Payer: Medicare Other | Admitting: General Surgery

## 2011-03-07 ENCOUNTER — Encounter (INDEPENDENT_AMBULATORY_CARE_PROVIDER_SITE_OTHER): Payer: Self-pay | Admitting: General Surgery

## 2011-03-07 ENCOUNTER — Ambulatory Visit (INDEPENDENT_AMBULATORY_CARE_PROVIDER_SITE_OTHER): Payer: Medicare Other | Admitting: Family Medicine

## 2011-03-07 VITALS — BP 102/68 | HR 60 | Temp 96.9°F | Resp 14 | Ht 61.0 in | Wt 123.2 lb

## 2011-03-07 DIAGNOSIS — I4891 Unspecified atrial fibrillation: Secondary | ICD-10-CM

## 2011-03-07 DIAGNOSIS — Z7901 Long term (current) use of anticoagulants: Secondary | ICD-10-CM

## 2011-03-07 DIAGNOSIS — C50919 Malignant neoplasm of unspecified site of unspecified female breast: Secondary | ICD-10-CM

## 2011-03-07 DIAGNOSIS — C50912 Malignant neoplasm of unspecified site of left female breast: Secondary | ICD-10-CM

## 2011-03-07 DIAGNOSIS — Z5181 Encounter for therapeutic drug level monitoring: Secondary | ICD-10-CM

## 2011-03-07 NOTE — Progress Notes (Signed)
Operation: Left partial mastectomy  Date: March 26, 2010  Stage: T1cNx  Hormone receptor status: Positive  HPI: [name] [name] is here for followup of left breast cancer. Last visit was August 31, 2010. Recent mammogram (January 17, 2011) was BI-RADS 2. No palpable masses in either breast. No pain in either breast. No reported adenopathy in the cervical or axillary area.  Taking Arimidex.  PE: Gen.-elderly female in no acute distress.  Right breast-soft, no palpable masses, suspicious skin changes, or nipple discharge.  Left breast-upper outer quadrant scar well healed; no palpable masses present.  Lymph nodes-no palpable cervical, supraclavicular, or axillary adenopathy.  Assessment: Hormone receptor positive left breast cancer status post partial mastectomy-no clinical or radiographic evidence of recurrence.  Plan:  Return visit 4 months.

## 2011-03-07 NOTE — Patient Instructions (Signed)
2,5mg  daily , check in 2 weeks

## 2011-03-22 ENCOUNTER — Ambulatory Visit (INDEPENDENT_AMBULATORY_CARE_PROVIDER_SITE_OTHER): Payer: Medicare Other | Admitting: Family Medicine

## 2011-03-22 DIAGNOSIS — I4891 Unspecified atrial fibrillation: Secondary | ICD-10-CM

## 2011-03-22 DIAGNOSIS — Z5181 Encounter for therapeutic drug level monitoring: Secondary | ICD-10-CM

## 2011-03-22 DIAGNOSIS — Z7901 Long term (current) use of anticoagulants: Secondary | ICD-10-CM

## 2011-03-22 LAB — POCT INR: INR: 1.8

## 2011-03-22 NOTE — Patient Instructions (Signed)
2,5mg  daily , except 5 mg every Tuesday. ( increased by 2.5 mg)recheck in 2 weeks

## 2011-04-05 ENCOUNTER — Ambulatory Visit (INDEPENDENT_AMBULATORY_CARE_PROVIDER_SITE_OTHER): Payer: Medicare Other | Admitting: Family Medicine

## 2011-04-05 DIAGNOSIS — I4891 Unspecified atrial fibrillation: Secondary | ICD-10-CM

## 2011-04-05 DIAGNOSIS — Z5181 Encounter for therapeutic drug level monitoring: Secondary | ICD-10-CM

## 2011-04-05 DIAGNOSIS — Z7901 Long term (current) use of anticoagulants: Secondary | ICD-10-CM

## 2011-04-05 NOTE — Patient Instructions (Signed)
Continue current dose, check in 4 weeks  

## 2011-04-29 ENCOUNTER — Other Ambulatory Visit: Payer: Self-pay | Admitting: *Deleted

## 2011-04-29 NOTE — Telephone Encounter (Signed)
Pt called and stated that she has been calling her pharmacy for a week for her "cancer medicine" and no one will call her back or refill her medicine.  RN called patient and she stated that she uses [facility] [phone].  RN advised that she will contact pharmacy for refill request.  RN called [facility] as directed and spoke with Sameer who advised he has been faxing requests over for approximately one week.  I advised that he fax over the request to #[phone].    I notified Rosalind RN that refill request was to follow.

## 2011-05-02 ENCOUNTER — Other Ambulatory Visit: Payer: Self-pay | Admitting: *Deleted

## 2011-05-02 DIAGNOSIS — C50319 Malignant neoplasm of lower-inner quadrant of unspecified female breast: Secondary | ICD-10-CM

## 2011-05-02 MED ORDER — ANASTROZOLE 1 MG PO TABS: 1.0000 mg | ORAL_TABLET | Freq: Every day | ORAL | Status: DC

## 2011-05-02 NOTE — Telephone Encounter (Signed)
Called [facility] to learn this refill has been sent electronically to Lonna Cobb NP.  Correct provider is Dr. Pierce Crane.  Per [facility] they began sending the refill request on 04-26-11.  Asked that they correct prescriber and physically send faxed refill request to [phone] instead of provider's in-basket.  Pharmacist will try to fix this one but reports what they are able to send out is based on what they received.

## 2011-05-03 ENCOUNTER — Ambulatory Visit (INDEPENDENT_AMBULATORY_CARE_PROVIDER_SITE_OTHER): Payer: Medicare Other | Admitting: Family Medicine

## 2011-05-03 DIAGNOSIS — Z7901 Long term (current) use of anticoagulants: Secondary | ICD-10-CM

## 2011-05-03 DIAGNOSIS — Z5181 Encounter for therapeutic drug level monitoring: Secondary | ICD-10-CM

## 2011-05-03 DIAGNOSIS — I4891 Unspecified atrial fibrillation: Secondary | ICD-10-CM

## 2011-05-03 NOTE — Patient Instructions (Signed)
Continue current dose, check in 4 weeks  

## 2011-06-01 ENCOUNTER — Ambulatory Visit (INDEPENDENT_AMBULATORY_CARE_PROVIDER_SITE_OTHER): Payer: Medicare Other | Admitting: Family Medicine

## 2011-06-01 DIAGNOSIS — I4891 Unspecified atrial fibrillation: Secondary | ICD-10-CM

## 2011-06-01 DIAGNOSIS — Z7901 Long term (current) use of anticoagulants: Secondary | ICD-10-CM

## 2011-06-01 DIAGNOSIS — Z5181 Encounter for therapeutic drug level monitoring: Secondary | ICD-10-CM

## 2011-06-01 NOTE — Patient Instructions (Signed)
Start 2.5mg  daily , except 5 mg Wed recheck 2 weeks

## 2011-06-15 ENCOUNTER — Ambulatory Visit (INDEPENDENT_AMBULATORY_CARE_PROVIDER_SITE_OTHER): Payer: Medicare Other | Admitting: Family Medicine

## 2011-06-15 DIAGNOSIS — Z7901 Long term (current) use of anticoagulants: Secondary | ICD-10-CM

## 2011-06-15 DIAGNOSIS — Z5181 Encounter for therapeutic drug level monitoring: Secondary | ICD-10-CM

## 2011-06-15 DIAGNOSIS — I4891 Unspecified atrial fibrillation: Secondary | ICD-10-CM

## 2011-06-15 LAB — POCT INR: INR: 2.3

## 2011-06-15 NOTE — Patient Instructions (Signed)
Continue current dose, check in 4 weeks  

## 2011-06-23 ENCOUNTER — Encounter (INDEPENDENT_AMBULATORY_CARE_PROVIDER_SITE_OTHER): Payer: Self-pay | Admitting: General Surgery

## 2011-06-25 ENCOUNTER — Other Ambulatory Visit: Payer: Self-pay | Admitting: Family Medicine

## 2011-07-04 ENCOUNTER — Ambulatory Visit (INDEPENDENT_AMBULATORY_CARE_PROVIDER_SITE_OTHER): Payer: Medicare Other | Admitting: Family Medicine

## 2011-07-04 ENCOUNTER — Encounter: Payer: Self-pay | Admitting: Family Medicine

## 2011-07-04 VITALS — BP 130/78 | HR 94 | Temp 97.7°F | Ht 62.0 in | Wt 125.0 lb

## 2011-07-04 DIAGNOSIS — N390 Urinary tract infection, site not specified: Secondary | ICD-10-CM

## 2011-07-04 DIAGNOSIS — R300 Dysuria: Secondary | ICD-10-CM

## 2011-07-04 LAB — POCT URINALYSIS DIPSTICK
Bilirubin, UA: NEGATIVE
Ketones, UA: NEGATIVE
Nitrite, UA: NEGATIVE
pH, UA: 6

## 2011-07-04 MED ORDER — SULFAMETHOXAZOLE-TRIMETHOPRIM 400-80 MG PO TABS: 1.0000 | ORAL_TABLET | Freq: Two times a day (BID) | ORAL | Status: DC

## 2011-07-04 NOTE — Progress Notes (Signed)
Patient presents with burning, urgency. No vaginal discharge or external irritation.  No STD exposure. No abd pain, no flank pain.  ROS: GEN:  no fevers, chills. GI: No n/v/d, eating normally Otherwise, ROS is as per the HPI.  PHYSICAL EXAM  Blood pressure 130/78, pulse 94, temperature 97.7 F (36.5 C), temperature source Oral, height 5\' 2"  (1.575 m), weight 125 lb (56.7 kg), SpO2 96.00%.  GEN: WDWN, A&Ox4,NAD. Non-toxic HEENT: Atraumatc, normocephalic. CV: RRR, No M/G/R PULM: CTA B, No wheezes, crackles, or rhonchi ABD: S, NT, ND, +BS, no rebound. No CVAT. No suprapubic tenderness. EXT: No c/c/e  A/P: UTI. Rx with ABX as below ua with blood and le

## 2011-07-06 LAB — URINE CULTURE

## 2011-07-07 ENCOUNTER — Ambulatory Visit (INDEPENDENT_AMBULATORY_CARE_PROVIDER_SITE_OTHER): Payer: Medicare Other | Admitting: Internal Medicine

## 2011-07-07 ENCOUNTER — Encounter: Payer: Self-pay | Admitting: Internal Medicine

## 2011-07-07 VITALS — BP 118/60 | HR 60 | Ht 62.0 in | Wt 125.0 lb

## 2011-07-07 DIAGNOSIS — I4891 Unspecified atrial fibrillation: Secondary | ICD-10-CM

## 2011-07-07 DIAGNOSIS — E785 Hyperlipidemia, unspecified: Secondary | ICD-10-CM

## 2011-07-07 DIAGNOSIS — I10 Essential (primary) hypertension: Secondary | ICD-10-CM

## 2011-07-07 NOTE — Patient Instructions (Signed)
Your physician wants you to follow-up in:  12 months.  You will receive a reminder letter in the mail two months in advance. If you don't receive a letter, please call our office to schedule the follow-up appointment.   

## 2011-07-07 NOTE — Assessment & Plan Note (Signed)
Denies symptoms.  Keep on coumadin.

## 2011-07-07 NOTE — Progress Notes (Signed)
HPI Patient is an 78 year old with a history of PAF, hypertension and dyslipidemia.  I saw her in the fall of 2011.   Since seen she has done well from a cardiac standpoint.  She denies CP.  No dizziness.  No palpitaitons.  No dyspnea.  Activity is limited by DJD of leg. No Known Allergies  Current Outpatient Prescriptions  Medication Sig Dispense Refill  . allopurinol (ZYLOPRIM) 300 MG tablet TAKE ONE TABLET BY MOUTH EVERY DAY  30 tablet  6  . anastrozole (ARIMIDEX) 1 MG tablet Take 1 tablet (1 mg total) by mouth daily.  30 tablet  12  . levothyroxine (SYNTHROID, LEVOTHROID) 50 MCG tablet TAKE ONE TABLET BY MOUTH EVERY DAY  30 tablet  6  . lisinopril-hydrochlorothiazide (PRINZIDE,ZESTORETIC) 20-12.5 MG per tablet TAKE ONE TABLET BY MOUTH EVERY DAY  30 tablet  6  . metoprolol succinate (TOPROL-XL) 25 MG 24 hr tablet TAKE ONE TABLET BY MOUTH EVERY DAY  30 tablet  6  . Multiple Vitamins-Minerals (EYE-VITE EXTRA PO) Take by mouth.        Marland Kitchen omeprazole (PRILOSEC) 20 MG capsule TAKE ONE CAPSULE BY MOUTH EVERY DAY  30 capsule  6  . simvastatin (ZOCOR) 20 MG tablet TAKE ONE TABLET BY MOUTH EVERY DAY  30 tablet  6  . TAZTIA XT 180 MG 24 hr capsule TAKE ONE CAPSULE BY MOUTH EVERY DAY  30 each  6  . warfarin (COUMADIN) 5 MG tablet TAKE AS DIRECTED  30 tablet  12  . zolpidem (AMBIEN) 5 MG tablet TAKE ONE-HALF TABLET BY MOUTH AT BEDTIME AS NEEDED  30 tablet  5    Past Medical History  Diagnosis Date  . Hypertension   . Heart murmur   . Gout   . Hypothyroidism   . Afib   . Muscular degeneration   . Breast cancer, left breast   . Arthritis   . Peptic ulcer disease   . Anemia   . Hyperlipidemia   . Cancer     breast    Past Surgical History  Procedure Date  . Cesarean section   . Appendectomy   . Breast surgery     left  . Upper gastrointestinal endoscopy   . Breast lumpectomy october 2011    left     Family History  Problem Relation Age of Onset  . Cancer Sister     History    Social History  . Marital Status: Widowed    Spouse Name: N/A    Number of Children: N/A  . Years of Education: N/A   Occupational History  . retired [employer]    Social History Main Topics  . Smoking status: Never Smoker   . Smokeless tobacco: Never Used  . Alcohol Use: No  . Drug Use: No  . Sexually Active: Not on file   Other Topics Concern  . Not on file   Social History Narrative  . No narrative on file    Review of Systems:  All systems reviewed.  They are negative to the above problem except as previously stated.  Vital Signs: BP 118/60  Pulse 60  Ht 5\' 2"  (1.575 m)  Wt 125 lb (56.7 kg)  BMI 22.86 kg/m2  Physical Exam Patient is in NAD  HEENT:  Normocephalic, atraumatic. EOMI, PERRLA.  Neck: JVP is normal. No thyromegaly. No bruits.  Lungs: clear to auscultation. No rales no wheezes.  Heart: Regular rate and rhythm. Normal S1, S2. No S3.  No significant murmurs. PMI not displaced.  Abdomen:  Supple, nontender. Normal bowel sounds. No masses. No hepatomegaly.  Extremities:   Good distal pulses throughout. No lower extremity edema.  Musculoskeletal :moving all extremities.  Neuro:   alert and oriented x3.  CN II-XII grossly intact.  EKG:  Probable SR.  Occasional PVC Assessment and Plan:

## 2011-07-07 NOTE — Assessment & Plan Note (Signed)
Adequate control. 

## 2011-07-07 NOTE — Assessment & Plan Note (Signed)
Keep on same regimen. 

## 2011-07-18 ENCOUNTER — Other Ambulatory Visit: Payer: Self-pay | Admitting: Family Medicine

## 2011-07-18 ENCOUNTER — Ambulatory Visit (INDEPENDENT_AMBULATORY_CARE_PROVIDER_SITE_OTHER): Payer: Medicare Other | Admitting: Family Medicine

## 2011-07-18 DIAGNOSIS — I4891 Unspecified atrial fibrillation: Secondary | ICD-10-CM

## 2011-07-18 DIAGNOSIS — Z7901 Long term (current) use of anticoagulants: Secondary | ICD-10-CM

## 2011-07-18 DIAGNOSIS — Z5181 Encounter for therapeutic drug level monitoring: Secondary | ICD-10-CM

## 2011-07-18 LAB — POCT INR: INR: 2.4

## 2011-07-18 NOTE — Patient Instructions (Signed)
Continue current dose, check in 4 weeks  

## 2011-07-18 NOTE — Telephone Encounter (Signed)
rx called to pharmacy 

## 2011-07-18 NOTE — Telephone Encounter (Signed)
Ok to refill 30, 5 refills 

## 2011-07-19 ENCOUNTER — Ambulatory Visit: Payer: Medicare Other

## 2011-07-25 ENCOUNTER — Other Ambulatory Visit: Payer: Self-pay | Admitting: Family Medicine

## 2011-08-02 ENCOUNTER — Ambulatory Visit (HOSPITAL_BASED_OUTPATIENT_CLINIC_OR_DEPARTMENT_OTHER): Payer: Medicare Other | Admitting: Oncology

## 2011-08-02 VITALS — BP 112/67 | HR 87 | Temp 97.4°F | Ht 62.0 in | Wt 126.2 lb

## 2011-08-02 DIAGNOSIS — M810 Age-related osteoporosis without current pathological fracture: Secondary | ICD-10-CM

## 2011-08-02 DIAGNOSIS — C50919 Malignant neoplasm of unspecified site of unspecified female breast: Secondary | ICD-10-CM

## 2011-08-02 DIAGNOSIS — C50419 Malignant neoplasm of upper-outer quadrant of unspecified female breast: Secondary | ICD-10-CM

## 2011-08-02 NOTE — Progress Notes (Signed)
Hematology and Oncology Follow Up Visit  [name] [name] [id number] 11-03-1923 78 y.o. 08/02/2011 4:18 PM PCP Dr Ailene Ravel Principle Diagnosis: A sister with history of T1 C. N0 ER/PR positive breast cancer status post lumpectomy on 03/20/2010 on adjuvant Arimidex History of chronic atrial fibrillation on Coumadin History of osteoporosis  Interim History:  There have been no intercurrent illness, hospitalizations or medication changes. Agent is doing well. She has had no intercurrent illnesses. There've been no complications related to a true fibrillation and her INR has been stable at 2.5.  Medications: I have reviewed the patient's current medications.  Allergies: No Known Allergies  Past Medical History, Surgical history, Social history, and Family History were reviewed and updated.  Review of Systems: Constitutional:  Negative for fever, chills, night sweats, anorexia, weight loss, pain. Cardiovascular: no chest pain or dyspnea on exertion Respiratory: no cough, shortness of breath, or wheezing Neurological: no TIA or stroke symptoms Dermatological: negative ENT: negative Skin Gastrointestinal: negative Genito-Urinary: negative Hematological and Lymphatic: negative Breast: negative Musculoskeletal: negative Remaining ROS negative.  Physical Exam: Blood pressure 112/67, pulse 87, temperature 97.4 F (36.3 C), temperature source Oral, height 5\' 2"  (1.575 m), weight 126 lb 3.2 oz (57.244 kg). ECOG: 0 General appearance: alert, cooperative and appears stated age Head: Normocephalic, without obvious abnormality, atraumatic Neck: no adenopathy, no carotid bruit, no JVD, supple, symmetrical, trachea midline and thyroid not enlarged, symmetric, no tenderness/mass/nodules Lymph nodes: Cervical, supraclavicular, and axillary nodes normal. Cardiac : regular rate and rhythm, no murmurs or gallops Pulmonary:clear to auscultation bilaterally and normal percussion bilaterally Breasts:  inspection negative, no nipple discharge or bleeding, no masses or nodularity palpable Abdomen:soft, non-tender; bowel sounds normal; no masses,  no organomegaly Extremities negative Neuro: alert, oriented, normal speech, no focal findings or movement disorder noted  Lab Results: Lab Results  Component Value Date   WBC 9.6 08/09/2010   HGB 12.2 08/09/2010   HCT 37.1 08/09/2010   MCV 90.9 08/09/2010   PLT 296 08/09/2010     Chemistry      Component Value Date/Time   NA 139 08/09/2010 1550   K 3.2* 08/09/2010 1550   CL 103 08/09/2010 1550   CO2 24 08/09/2010 1550   BUN 25* 08/09/2010 1550   CREATININE 1.29* 08/09/2010 1550      Component Value Date/Time   CALCIUM 9.3 08/09/2010 1550   ALKPHOS 75 08/09/2010 1550   AST 31 08/09/2010 1550   ALT 19 08/09/2010 1550   BILITOT 0.6 08/09/2010 1550      .pathology. Radiological Studies: chest X-ray n/a Mammogram Due 8/13 Bone density T-3.0 9/12;  Impression and Plan: Verlon Au 69-year-old woman with history of node-negative ER/PR positive breast cancer. She is stable on her Arimidex without significant side effects. Her most recent bone density test suggested as osteoporosis which she has had in the past. I raised the issue with her and her daughter. I've recommended that we consider a form of biphosphonate therapy. She does not wish to take any more medication. I have therefore recommended that we consider poorly injections. I will then try to get preapproval for this and anticipates starting this some time next few weeks and continue this every 6 months. I've also recommended that she see Dr. Abbey Chatters and I alternatively so that I will see her now in a years time.  More than 50% of the visit was spent in patient-related counselling   Pierce Crane, MD 3/5/20134:18 PM

## 2011-08-03 ENCOUNTER — Telehealth: Payer: Self-pay | Admitting: Oncology

## 2011-08-03 NOTE — Telephone Encounter (Signed)
I HAD STARTED A CASE ON LINE AND PUT IN 08/02/11 INSTEAD OF TODAY.CASE ID [id number].  I CALLED [employer] AND SPOKE TO MRS BOSS AND ONLY  [facility] REQUIRES PRECERT AND SHE REMOVED THE CASE.

## 2011-08-16 ENCOUNTER — Ambulatory Visit: Payer: Medicare Other

## 2011-08-17 ENCOUNTER — Ambulatory Visit (INDEPENDENT_AMBULATORY_CARE_PROVIDER_SITE_OTHER): Payer: Medicare Other

## 2011-08-17 ENCOUNTER — Ambulatory Visit (INDEPENDENT_AMBULATORY_CARE_PROVIDER_SITE_OTHER): Payer: Medicare Other | Admitting: Family Medicine

## 2011-08-17 DIAGNOSIS — Z7901 Long term (current) use of anticoagulants: Secondary | ICD-10-CM

## 2011-08-17 DIAGNOSIS — Z5181 Encounter for therapeutic drug level monitoring: Secondary | ICD-10-CM

## 2011-08-17 DIAGNOSIS — I4891 Unspecified atrial fibrillation: Secondary | ICD-10-CM

## 2011-08-17 NOTE — Patient Instructions (Signed)
Continue current dose, check in 4 weeks  

## 2011-08-18 ENCOUNTER — Ambulatory Visit: Payer: Medicare Other

## 2011-08-29 ENCOUNTER — Telehealth (INDEPENDENT_AMBULATORY_CARE_PROVIDER_SITE_OTHER): Payer: Self-pay | Admitting: General Surgery

## 2011-08-30 ENCOUNTER — Telehealth (INDEPENDENT_AMBULATORY_CARE_PROVIDER_SITE_OTHER): Payer: Self-pay

## 2011-08-30 NOTE — Telephone Encounter (Signed)
I spoke with Ms. Busic about the soreness in her left axillary area.  She said it just began hurting this past Saturday.  She does not remember any heavy lifting with that arm or any strenuous activity.  She will leave her f/u appoint at 09/23/11, but I encouraged her to call us back if the soreness worsened and she noticed any swelling.  She agreed.

## 2011-09-13 ENCOUNTER — Ambulatory Visit: Payer: Medicare Other

## 2011-09-14 ENCOUNTER — Ambulatory Visit (INDEPENDENT_AMBULATORY_CARE_PROVIDER_SITE_OTHER): Payer: Medicare Other | Admitting: Family Medicine

## 2011-09-14 ENCOUNTER — Encounter: Payer: Self-pay | Admitting: Family Medicine

## 2011-09-14 VITALS — BP 120/68 | HR 95 | Temp 97.5°F | Ht 61.5 in | Wt 126.8 lb

## 2011-09-14 DIAGNOSIS — E785 Hyperlipidemia, unspecified: Secondary | ICD-10-CM

## 2011-09-14 DIAGNOSIS — I4891 Unspecified atrial fibrillation: Secondary | ICD-10-CM

## 2011-09-14 DIAGNOSIS — Z0000 Encounter for general adult medical examination without abnormal findings: Secondary | ICD-10-CM

## 2011-09-14 DIAGNOSIS — D509 Iron deficiency anemia, unspecified: Secondary | ICD-10-CM

## 2011-09-14 DIAGNOSIS — Z79899 Other long term (current) drug therapy: Secondary | ICD-10-CM

## 2011-09-14 DIAGNOSIS — I10 Essential (primary) hypertension: Secondary | ICD-10-CM

## 2011-09-14 DIAGNOSIS — M109 Gout, unspecified: Secondary | ICD-10-CM

## 2011-09-14 DIAGNOSIS — Z5181 Encounter for therapeutic drug level monitoring: Secondary | ICD-10-CM

## 2011-09-14 DIAGNOSIS — M1990 Unspecified osteoarthritis, unspecified site: Secondary | ICD-10-CM

## 2011-09-14 DIAGNOSIS — Z7901 Long term (current) use of anticoagulants: Secondary | ICD-10-CM

## 2011-09-14 DIAGNOSIS — E039 Hypothyroidism, unspecified: Secondary | ICD-10-CM

## 2011-09-14 LAB — LIPID PANEL
LDL Cholesterol: 74 mg/dL (ref 0–99)
Total CHOL/HDL Ratio: 3
Triglycerides: 144 mg/dL (ref 0.0–149.0)
VLDL: 28.8 mg/dL (ref 0.0–40.0)

## 2011-09-14 LAB — BASIC METABOLIC PANEL
BUN: 17 mg/dL (ref 6–23)
Chloride: 102 mEq/L (ref 96–112)
Potassium: 3.4 mEq/L — ABNORMAL LOW (ref 3.5–5.1)
Sodium: 139 mEq/L (ref 135–145)

## 2011-09-14 LAB — CBC WITH DIFFERENTIAL/PLATELET
Basophils Relative: 0.9 % (ref 0.0–3.0)
Eosinophils Relative: 2.5 % (ref 0.0–5.0)
HCT: 36.7 % (ref 36.0–46.0)
Hemoglobin: 11.8 g/dL — ABNORMAL LOW (ref 12.0–15.0)
Lymphs Abs: 2.1 10*3/uL (ref 0.7–4.0)
MCV: 90.4 fl (ref 78.0–100.0)
Monocytes Absolute: 0.6 10*3/uL (ref 0.1–1.0)
Monocytes Relative: 8.3 % (ref 3.0–12.0)
Neutro Abs: 4.1 10*3/uL (ref 1.4–7.7)
Platelets: 305 10*3/uL (ref 150.0–400.0)
WBC: 7 10*3/uL (ref 4.5–10.5)

## 2011-09-14 LAB — HEPATIC FUNCTION PANEL
ALT: 15 U/L (ref 0–35)
Alkaline Phosphatase: 89 U/L (ref 39–117)
Bilirubin, Direct: 0 mg/dL (ref 0.0–0.3)
Total Protein: 7.6 g/dL (ref 6.0–8.3)

## 2011-09-14 NOTE — Progress Notes (Signed)
Patient Name: [name] [name] Date of Birth: 17-Oct-1923 Age: 78 y.o. Medical Record Number: [id number] Gender: female Date of Encounter: 09/14/2011  History of Present Illness:  [name] [name] is a 78 y.o. very pleasant female patient who presents with the following:  Legs hurting a little.  Health Maintenance Summary Reviewed and updated, unless pt declines services.  Tobacco History Reviewed. Non-smoker Alcohol: No concerns, no excessive use Exercise Habits: Some activity, rec at least 30 mins 5 times a week STD concerns: none Drug Use: None Lumps or breast concerns: L lateral chest last week sore, now better  Health Maintenance  Topic Date Due  . Tetanus/tdap  07/13/1944  . Zostavax  07/13/1985  . Pneumococcal Polysaccharide Vaccine Age 34 And Over  07/13/1990  . Influenza Vaccine  02/28/2012  Patient declines all vaccines.  HTN: Tolerating all medications without side effects Stable and at goal No CP, no sob. No HA.  BP Readings from Last 3 Encounters:  09/14/11 120/68  08/02/11 112/67  07/07/11 118/60    Basic Metabolic Panel:    Component Value Date/Time   NA 139 08/09/2010 1550   K 3.2* 08/09/2010 1550   CL 103 08/09/2010 1550   CO2 24 08/09/2010 1550   BUN 25* 08/09/2010 1550   CREATININE 1.29* 08/09/2010 1550   GLUCOSE 94 08/09/2010 1550   CALCIUM 9.3 08/09/2010 1550       Thyroid: No symptoms. Labs reviewed. Denies cold / heat intolerance, dry skin, hair loss. No goiter.  Lab Results  Component Value Date   TSH 0.67 01/24/2011   Lipids: Doing well, stable. Tolerating meds fine with no SE. Panel reviewed with patient.  Lipids:    Component Value Date/Time   CHOL 149 06/03/2010 0953   TRIG 120.0 06/03/2010 0953   HDL 51.80 06/03/2010 0953   VLDL 24.0 06/03/2010 0953   CHOLHDL 3 06/03/2010 0953    Lab Results  Component Value Date   ALT 19 08/09/2010   AST 31 08/09/2010   ALKPHOS 75 08/09/2010   BILITOT 0.6 08/09/2010   A fib, on coumadin, dilt.  OA, hips, occ  bothersome. Prn tylenol helps  Patient Active Problem List  Diagnoses  . INFILTRATING DUCTAL CARCINOMA, LEFT BREAST  . HYPOTHYROIDISM  . HYPERLIPIDEMIA  . GOUT  . ANEMIA-IRON DEFICIENCY  . INSOMNIA, CHRONIC  . HYPERTENSION  . PAROXYSMAL ATRIAL FIBRILLATION  . GERD  . PEPTIC ULCER DISEASE  . OSTEOARTHRITIS  . POSTMENOPAUSAL OSTEOPOROSIS  . VERTIGO, POSITIONAL  . Macular degeneration  . Osteopenia  . Tremor   Past Medical History  Diagnosis Date  . Hypertension   . Heart murmur   . Gout   . Hypothyroidism   . Afib   . Muscular degeneration   . Breast cancer, left breast   . Arthritis   . Peptic ulcer disease   . Anemia   . Hyperlipidemia   . Cancer     breast   Past Surgical History  Procedure Date  . Cesarean section   . Appendectomy   . Breast surgery     left  . Upper gastrointestinal endoscopy   . Breast lumpectomy october 2011    left    History  Substance Use Topics  . Smoking status: Never Smoker   . Smokeless tobacco: Never Used  . Alcohol Use: No   Family History  Problem Relation Age of Onset  . Cancer Sister    No Known Allergies Current Outpatient Prescriptions on File Prior to Visit  Medication Sig Dispense Refill  . allopurinol (ZYLOPRIM) 300 MG tablet TAKE ONE TABLET BY MOUTH EVERY DAY  30 tablet  6  . anastrozole (ARIMIDEX) 1 MG tablet Take 1 tablet (1 mg total) by mouth daily.  30 tablet  12  . denosumab (PROLIA) 60 MG/ML SOLN injection Inject 60 mg into the skin every 6 (six) months. Administer in upper arm, thigh, or abdomen      . diltiazem (DILACOR XR) 180 MG 24 hr capsule Take 180 mg by mouth daily.      Marland Kitchen levothyroxine (SYNTHROID, LEVOTHROID) 50 MCG tablet TAKE ONE TABLET BY MOUTH EVERY DAY  30 tablet  6  . lisinopril-hydrochlorothiazide (PRINZIDE,ZESTORETIC) 20-12.5 MG per tablet TAKE ONE TABLET BY MOUTH EVERY DAY  30 tablet  11  . metoprolol succinate (TOPROL-XL) 25 MG 24 hr tablet TAKE ONE TABLET BY MOUTH EVERY DAY  30 tablet   6  . Multiple Vitamins-Minerals (EYE-VITE EXTRA PO) Take by mouth.        Marland Kitchen omeprazole (PRILOSEC) 20 MG capsule TAKE ONE CAPSULE BY MOUTH EVERY DAY  30 capsule  6  . simvastatin (ZOCOR) 20 MG tablet TAKE ONE TABLET BY MOUTH EVERY DAY  30 tablet  6  . TAZTIA XT 180 MG 24 hr capsule TAKE ONE CAPSULE BY MOUTH EVERY DAY  30 each  6  . warfarin (COUMADIN) 5 MG tablet TAKE AS DIRECTED  30 tablet  12  . zolpidem (AMBIEN) 5 MG tablet TAKE ONE-HALF TABLET BY MOUTH AS NEEDED AT BEDTIME  30 tablet  5     Past Medical History, Surgical History, Social History, Family History, Problem List, Medications, and Allergies have been reviewed and updated if relevant.  Review of Systems:  General: Denies fever, chills, sweats. No significant weight loss. Eyes: Denies blurring,significant itching ENT: Denies earache, sore throat, and hoarseness.  Cardiovascular: Denies chest pains, palpitations, dyspnea on exertion,  Respiratory: Denies cough, dyspnea at rest,wheeezing Breast: no concerns about lumps GI: Denies nausea, vomiting, diarrhea, constipation, change in bowel habits, abdominal pain, melena, hematochezia GU: Denies dysuria, hematuria, urinary hesitancy, nocturia, denies STD risk, no concerns about discharge Musculoskeletal: as above Derm: Denies rash, itching Neuro: Denies  paresthesias, frequent falls, frequent headaches Psych: Denies depression, anxiety Endocrine: Denies cold intolerance, heat intolerance, polydipsia Heme: Denies enlarged lymph nodes Allergy: No hayfever   Physical Examination: Filed Vitals:   09/14/11 1019  BP: 120/68  Pulse: 95  Temp: 97.5 F (36.4 C)  TempSrc: Oral  Height: 5' 1.5" (1.562 m)  Weight: 126 lb 12.8 oz (57.516 kg)  SpO2: 97%    Body mass index is 23.57 kg/(m^2).   Wt Readings from Last 3 Encounters:  09/14/11 126 lb 12.8 oz (57.516 kg)  08/02/11 126 lb 3.2 oz (57.244 kg)  07/07/11 125 lb (56.7 kg)    GEN: well developed, well nourished, no  acute distress Eyes: conjunctiva and lids normal, PERRLA, EOMI ENT: TM clear, nares clear, oral exam WNL Neck: supple, no lymphadenopathy, no thyromegaly, no JVD Pulm: clear to auscultation and percussion, respiratory effort normal CV: regular rate and rhythm, S1-S2, no murmur, rub or gallop, no bruits Chest: no scars, masses, no lumps GI: soft, non-tender; no hepatosplenomegaly, masses; active bowel sounds all quadrants GU: defer Lymph: no cervical, axillary or inguinal adenopathy MSK: gait normal, muscle tone and strength SKIN: clear, good turgor, color WNL, no rashes, lesions, or ulcerations Neuro: normal mental status, normal strength, sensation, and motion Psych: alert; oriented to person, place and time, normally  interactive and not anxious or depressed in appearance.   Assessment and Plan:  1. Encounter for long-term (current) use of other medications  Basic metabolic panel  2. HYPOTHYROIDISM check levels, asx TSH  3. HYPERLIPIDEMIA check levels Hepatic function panel, Lipid panel  4. GOUT - rarely bothersome Uric acid  5. ANEMIA-IRON DEFICIENCY  CBC with Differential  6. HYPERTENSION - stable   7. PAROXYSMAL ATRIAL FIBRILLATION - stable on coumadin   8. OSTEOARTHRITIS - remain active. Prn tylenol when needed    I have personally reviewed the Medicare Annual Wellness questionnaire and have noted 1. The patient's medical and social history 2. Their use of alcohol, tobacco or illicit drugs 3. Their current medications and supplements 4. The patient's functional ability including ADL's, fall risks, home safety risks and hearing or visual             impairment. 5. Diet and physical activities 6. Evidence for depression or mood disorders  The patients weight, height, BMI and visual acuity have been recorded in the chart I have made referrals, counseling and provided education to the patient based review of the above and I have provided the pt with a written personalized care  plan for preventive services.  I have provided the patient with a copy of your personalized plan for preventive services. Instructed to take the time to review along with their updated medication list.   Orders Today: Orders Placed This Encounter  Procedures  . Basic metabolic panel  . CBC with Differential  . Hepatic function panel  . TSH  . Lipid panel  . Uric acid    Medications Today: No orders of the defined types were placed in this encounter.

## 2011-09-14 NOTE — Patient Instructions (Signed)
continue 2.5mg daily , except 5 mg Wed recheck 4 weeks 

## 2011-09-15 ENCOUNTER — Encounter: Payer: Self-pay | Admitting: *Deleted

## 2011-09-23 ENCOUNTER — Ambulatory Visit (INDEPENDENT_AMBULATORY_CARE_PROVIDER_SITE_OTHER): Payer: Medicare Other | Admitting: General Surgery

## 2011-09-23 ENCOUNTER — Encounter (INDEPENDENT_AMBULATORY_CARE_PROVIDER_SITE_OTHER): Payer: Self-pay | Admitting: General Surgery

## 2011-09-23 VITALS — BP 118/78 | HR 84 | Temp 97.3°F | Resp 18 | Ht 61.0 in | Wt 124.6 lb

## 2011-09-23 DIAGNOSIS — Z853 Personal history of malignant neoplasm of breast: Secondary | ICD-10-CM

## 2011-09-23 NOTE — Progress Notes (Signed)
Operation: Left partial mastectomy  Date: March 26, 2010  Stage: T1cNx  Hormone receptor status: Positive  HPI: [name] [name] is here for followup of left breast cancer.  She had some pain in her left upper chest wall and shoulder around Easter and was concerned that the cancer was back  However, the pain has completely resolved now.. No lumps in either breast.  No reported adenopathy in the cervical or axillary area.  Taking Arimidex.  PE: Gen.-elderly female in no acute distress.  Right breast-soft, no palpable masses, suspicious skin changes, or nipple discharge.  Left breast-upper outer quadrant scar well healed; no palpable masses present.  Lymph nodes-no palpable cervical, supraclavicular, or axillary adenopathy.  Assessment: Hormone receptor positive left breast cancer status post partial mastectomy-no clinical evidence of recurrence.  Plan:  Return visit 5 months.

## 2011-09-23 NOTE — Patient Instructions (Signed)
Call if you feel any lumps in your breasts. 

## 2011-09-26 ENCOUNTER — Other Ambulatory Visit: Payer: Self-pay | Admitting: Family Medicine

## 2011-10-04 ENCOUNTER — Telehealth: Payer: Self-pay

## 2011-10-04 ENCOUNTER — Ambulatory Visit: Payer: Medicare Other | Admitting: Family Medicine

## 2011-10-04 NOTE — Telephone Encounter (Signed)
Pt daughter said today pt had on and off facial tremors with difficulty speaking during tremor which last approx 1 minute. After tremor stops speech is clear. No confusion, no weakness in arms or legs noted. NO h/a no chest pain and no SOB. Frequency is more often as day progresses. Cheryl made appt with Dr Ermalene Searing today at 2:45pm. Pt does not want to go to ER. Pts son on his way to pts home now.No hx of seizures. Please advise.

## 2011-10-04 NOTE — Telephone Encounter (Signed)
Dr Ermalene Searing concerned about pts condition and symptoms and strongly recommends pt go to ER. If pt will not go to ER Dr Ermalene Searing will see pt today at 2:45. Elnita Maxwell will contact her brother and see if pt will go to ER.Elnita Maxwell can be reached at [phone].

## 2011-10-10 ENCOUNTER — Ambulatory Visit: Payer: Medicare Other

## 2011-10-10 ENCOUNTER — Ambulatory Visit: Payer: Medicare Other | Admitting: Family Medicine

## 2011-10-12 ENCOUNTER — Encounter: Payer: Self-pay | Admitting: Family Medicine

## 2011-10-12 ENCOUNTER — Encounter: Payer: Self-pay | Admitting: *Deleted

## 2011-10-12 ENCOUNTER — Ambulatory Visit (INDEPENDENT_AMBULATORY_CARE_PROVIDER_SITE_OTHER): Payer: Medicare Other | Admitting: Family Medicine

## 2011-10-12 ENCOUNTER — Ambulatory Visit: Payer: Medicare Other

## 2011-10-12 VITALS — BP 130/80 | HR 88 | Temp 97.6°F | Ht 61.0 in | Wt 124.8 lb

## 2011-10-12 DIAGNOSIS — N289 Disorder of kidney and ureter, unspecified: Secondary | ICD-10-CM

## 2011-10-12 DIAGNOSIS — E876 Hypokalemia: Secondary | ICD-10-CM

## 2011-10-12 DIAGNOSIS — I4891 Unspecified atrial fibrillation: Secondary | ICD-10-CM

## 2011-10-12 HISTORY — DX: Disorder of kidney and ureter, unspecified: N28.9

## 2011-10-12 LAB — BASIC METABOLIC PANEL
Calcium: 9.4 mg/dL (ref 8.4–10.5)
Creatinine, Ser: 1.2 mg/dL (ref 0.4–1.2)
GFR: 46.59 mL/min — ABNORMAL LOW (ref >60.00)
Glucose, Bld: 104 mg/dL — ABNORMAL HIGH (ref 70–99)
Sodium: 137 mEq/L (ref 135–145)

## 2011-10-12 MED ORDER — POTASSIUM CHLORIDE CRYS ER 10 MEQ PO TBCR: 10.0000 meq | EXTENDED_RELEASE_TABLET | Freq: Every day | ORAL | Status: DC

## 2011-10-12 NOTE — Progress Notes (Signed)
Patient Name: [name] [name] Date of Birth: 04-May-1924 Medical Record Number: [id number] Gender: female Date of Encounter: 10/12/2011  History of Present Illness:  [name] [name] is a 78 y.o. very pleasant female patient who presents with the following:  Followup ER visit, which on nurse some hypokalemia and mild renal insufficiency which is not very significant from her baseline. Her baseline creatinine has been 1.0-1.4 over the last few years. In the ER it was 1.2. She had a mildly decreased potassium. She has had intermittent borderline potassium for some time.  She presented there with a primary complaint of some right thigh pain, which is intermittently been improved.  Patient Active Problem List  Diagnoses  . INFILTRATING DUCTAL CARCINOMA, LEFT BREAST  . HYPOTHYROIDISM  . HYPERLIPIDEMIA  . GOUT  . ANEMIA-IRON DEFICIENCY  . INSOMNIA, CHRONIC  . HYPERTENSION  . PAROXYSMAL ATRIAL FIBRILLATION  . GERD  . PEPTIC ULCER DISEASE  . OSTEOARTHRITIS  . POSTMENOPAUSAL OSTEOPOROSIS  . VERTIGO, POSITIONAL  . Macular degeneration  . Osteopenia  . Tremor   Past Medical History  Diagnosis Date  . Hypertension   . Heart murmur   . Gout   . Hypothyroidism   . Afib   . Muscular degeneration   . Breast cancer, left breast   . Arthritis   . Peptic ulcer disease   . Anemia   . Hyperlipidemia   . Cancer     Left breast   Past Surgical History  Procedure Date  . Cesarean section   . Appendectomy   . Upper gastrointestinal endoscopy   . Breast surgery     left  . Breast lumpectomy october 2011    left    History  Substance Use Topics  . Smoking status: Never Smoker   . Smokeless tobacco: Never Used  . Alcohol Use: No   Family History  Problem Relation Age of Onset  . Cancer Sister     unknown  . Cancer Daughter     stomach cancer   No Known Allergies  Medication list has been reviewed and updated.  Review of Systems:  GEN: No acute illnesses, no fevers, chills. GI:  No n/v/d, eating normally Pulm: No SOB Interactive and getting along well at home.  Otherwise, ROS is as per the HPI.   Physical Examination: Filed Vitals:   10/12/11 0853  BP: 130/80  Pulse: 88  Temp: 97.6 F (36.4 C)  TempSrc: Oral  Height: 5\' 1"  (1.549 m)  Weight: 124 lb 12.8 oz (56.609 kg)  SpO2: 98%    Body mass index is 23.58 kg/(m^2).   GEN: WDWN, NAD, Non-toxic, A & O x 3 HEENT: Atraumatic, Normocephalic. Neck supple. No masses, No LAD. Ears and Nose: No external deformity. CV: RRR, No M/G/R. No JVD. No thrill. No extra heart sounds. PULM: CTA B, no wheezes, crackles, rhonchi. No retractions. No resp. distress. No accessory muscle use. Right thigh is minimally tender to palpation. Primarily in the quadricep. EXTR: No c/c/e NEURO Normal gait.  PSYCH: Normally interactive. Conversant. Not depressed or anxious appearing.  Calm demeanor.    Assessment and Plan:  1. Hypokalemia  Basic metabolic panel  2. Renal insufficiency     Reassured her about her right thigh pain and discussed some basic quad strengthening exercises.  Recheck BMP given her recent low potassium and start on only 10 mEq K-dur  Renal function has essentially been stable over time  Orders Today: Orders Placed This Encounter  Procedures  . Basic  metabolic panel    Medications Today: Meds ordered this encounter  Medications  . potassium chloride (K-DUR,KLOR-CON) 10 MEQ tablet    Sig: Take 1 tablet (10 mEq total) by mouth daily.    Dispense:  90 tablet    Refill:  3

## 2011-10-12 NOTE — Patient Instructions (Signed)
continue 2.5mg daily , except 5 mg Wed recheck 4 weeks 

## 2011-10-13 NOTE — Telephone Encounter (Signed)
Patient went to ed

## 2011-10-21 ENCOUNTER — Telehealth: Payer: Self-pay | Admitting: Family Medicine

## 2011-10-21 NOTE — Telephone Encounter (Signed)
Received 5 pages from [facility], faxed to Dr. Patsy Lager at [place] on [address]. sd 10/21/11.

## 2011-10-27 ENCOUNTER — Other Ambulatory Visit: Payer: Self-pay | Admitting: Family Medicine

## 2011-11-09 ENCOUNTER — Ambulatory Visit: Payer: Medicare Other

## 2011-11-10 ENCOUNTER — Ambulatory Visit (INDEPENDENT_AMBULATORY_CARE_PROVIDER_SITE_OTHER): Payer: Medicare Other | Admitting: Family Medicine

## 2011-11-10 ENCOUNTER — Encounter: Payer: Self-pay | Admitting: Family Medicine

## 2011-11-10 VITALS — BP 120/62 | HR 99 | Temp 97.7°F | Ht 61.0 in | Wt 124.5 lb

## 2011-11-10 DIAGNOSIS — R259 Unspecified abnormal involuntary movements: Secondary | ICD-10-CM

## 2011-11-10 DIAGNOSIS — Z5181 Encounter for therapeutic drug level monitoring: Secondary | ICD-10-CM

## 2011-11-10 DIAGNOSIS — Z7901 Long term (current) use of anticoagulants: Secondary | ICD-10-CM

## 2011-11-10 DIAGNOSIS — I4891 Unspecified atrial fibrillation: Secondary | ICD-10-CM

## 2011-11-10 DIAGNOSIS — R251 Tremor, unspecified: Secondary | ICD-10-CM

## 2011-11-10 LAB — POCT INR: INR: 2.2

## 2011-11-10 NOTE — Progress Notes (Signed)
>  15 minutes spent in face to face time with patient, >50% spent in counselling or coordination of care: has a sleep-deprived EEG scheduled from neuro. Has some "spells" with twitching every 6 months or so. Conscious the whole time. Feels like she will get it if nervous or tired. Already does not drive. The patient does not want to stay up all night for test, never had any type of tonic-clonic activity. I d/w her and I think it is low risk to post-pone her test, which she doesn't want to do anyway, she already is not driving, and she does not want any more medication. We discussed that theoretically the goal of this test would be to pick up some seizure activity.

## 2011-11-10 NOTE — Patient Instructions (Signed)
Continue current dose, check in 4 weeks  

## 2011-12-07 ENCOUNTER — Ambulatory Visit (INDEPENDENT_AMBULATORY_CARE_PROVIDER_SITE_OTHER): Payer: Medicare Other | Admitting: Family Medicine

## 2011-12-07 DIAGNOSIS — Z7901 Long term (current) use of anticoagulants: Secondary | ICD-10-CM

## 2011-12-07 DIAGNOSIS — I4891 Unspecified atrial fibrillation: Secondary | ICD-10-CM

## 2011-12-07 DIAGNOSIS — Z5181 Encounter for therapeutic drug level monitoring: Secondary | ICD-10-CM

## 2011-12-07 NOTE — Patient Instructions (Signed)
continue 2.5mg  daily , except 5 mg Wed recheck 4 weeks

## 2011-12-13 ENCOUNTER — Telehealth: Payer: Self-pay | Admitting: Family Medicine

## 2011-12-13 NOTE — Telephone Encounter (Signed)
Pt is needing referral to Dr. Hart [place] ??? an Arthritis Physician.

## 2011-12-19 ENCOUNTER — Other Ambulatory Visit: Payer: Self-pay | Admitting: Family Medicine

## 2011-12-20 NOTE — Telephone Encounter (Signed)
I would be happy to refer her to anyone she would like. What reason? I have not heard of Dr. Anson [place]. If she knows his office telephone number or his practice, then we can. I cannot find his office on a quick search.

## 2011-12-22 ENCOUNTER — Other Ambulatory Visit: Payer: Self-pay | Admitting: Family Medicine

## 2011-12-22 DIAGNOSIS — M1990 Unspecified osteoarthritis, unspecified site: Secondary | ICD-10-CM

## 2011-12-22 DIAGNOSIS — M109 Gout, unspecified: Secondary | ICD-10-CM

## 2011-12-22 NOTE — Progress Notes (Signed)
Consult made at her request dr. Dierdre Forth.

## 2011-12-22 NOTE — Telephone Encounter (Signed)
Dr. Alben Deeds [address] [place], [place] [address]   Rheumatology

## 2011-12-22 NOTE — Telephone Encounter (Signed)
done

## 2012-01-02 ENCOUNTER — Encounter: Payer: Self-pay | Admitting: Family Medicine

## 2012-01-02 ENCOUNTER — Telehealth: Payer: Self-pay

## 2012-01-02 ENCOUNTER — Ambulatory Visit (INDEPENDENT_AMBULATORY_CARE_PROVIDER_SITE_OTHER): Payer: Medicare Other | Admitting: Family Medicine

## 2012-01-02 VITALS — BP 100/60 | HR 100 | Temp 97.5°F | Wt 121.5 lb

## 2012-01-02 DIAGNOSIS — I4891 Unspecified atrial fibrillation: Secondary | ICD-10-CM

## 2012-01-02 DIAGNOSIS — K529 Noninfective gastroenteritis and colitis, unspecified: Secondary | ICD-10-CM

## 2012-01-02 DIAGNOSIS — R079 Chest pain, unspecified: Secondary | ICD-10-CM

## 2012-01-02 DIAGNOSIS — Z5181 Encounter for therapeutic drug level monitoring: Secondary | ICD-10-CM

## 2012-01-02 DIAGNOSIS — Z7901 Long term (current) use of anticoagulants: Secondary | ICD-10-CM

## 2012-01-02 DIAGNOSIS — K5289 Other specified noninfective gastroenteritis and colitis: Secondary | ICD-10-CM

## 2012-01-02 NOTE — Progress Notes (Signed)
Nature conservation officer at [facility] [address] [place] [address] Phone: [phone] Fax: [phone]  Date:  01/02/2012   Name:  [name] [name]   DOB:  1923/06/25   MRN:  [id number]  PCP:  [name], MD    Chief Complaint: Chest Pain   History of Present Illness:  [name] [name] is a 78 y.o. very pleasant female patient who presents with the following:  N/v/d: also with some cp and sob (per report from LPN, but patient denies this to me)  Throwing up much of the night, having some diarrhea, too, but this is not new. Some tightness in chest, and felt like indigestion. No chest pain, no shortness of breath. Some SOB with walking, but that is not new.   Her diarrhea and stomach discomfort are much improved this morning, but they were quite severe over the night. She also has had diarrhea, but normally has loose stools anyway. No blood or mucous in either her vomitus or stool. She is currently afebrile.  Patient Active Problem List  Diagnosis  . INFILTRATING DUCTAL CARCINOMA, LEFT BREAST  . HYPOTHYROIDISM  . HYPERLIPIDEMIA  . GOUT  . ANEMIA-IRON DEFICIENCY  . INSOMNIA, CHRONIC  . HYPERTENSION  . PAROXYSMAL ATRIAL FIBRILLATION  . GERD  . PEPTIC ULCER DISEASE  . OSTEOARTHRITIS  . POSTMENOPAUSAL OSTEOPOROSIS  . VERTIGO, POSITIONAL  . Macular degeneration  . Osteopenia  . Tremor  . Renal insufficiency    Past Medical History  Diagnosis Date  . Hypertension   . Heart murmur   . Gout   . Hypothyroidism   . Afib   . Muscular degeneration   . Breast cancer, left breast   . Arthritis   . Peptic ulcer disease   . Anemia   . Hyperlipidemia   . Cancer     Left breast  . Renal insufficiency 10/12/2011    Past Surgical History  Procedure Date  . Cesarean section   . Appendectomy   . Upper gastrointestinal endoscopy   . Breast surgery     left  . Breast lumpectomy october 2011    left     History  Substance Use Topics  . Smoking status: Never Smoker    . Smokeless tobacco: Never Used  . Alcohol Use: No    Family History  Problem Relation Age of Onset  . Cancer Sister     unknown  . Cancer Daughter     stomach cancer    No Known Allergies  Medication list has been reviewed and updated.  Current Outpatient Prescriptions on File Prior to Visit  Medication Sig Dispense Refill  . allopurinol (ZYLOPRIM) 300 MG tablet TAKE ONE TABLET BY MOUTH EVERY DAY  30 tablet  6  . anastrozole (ARIMIDEX) 1 MG tablet Take 1 tablet (1 mg total) by mouth daily.  30 tablet  12  . levothyroxine (SYNTHROID, LEVOTHROID) 50 MCG tablet TAKE ONE TABLET BY MOUTH EVERY DAY  30 tablet  11  . lisinopril-hydrochlorothiazide (PRINZIDE,ZESTORETIC) 20-12.5 MG per tablet TAKE ONE TABLET BY MOUTH EVERY DAY  30 tablet  11  . metoprolol succinate (TOPROL-XL) 25 MG 24 hr tablet TAKE ONE TABLET BY MOUTH EVERY DAY  30 tablet  6  . Multiple Vitamins-Minerals (EYE-VITE EXTRA PO) Take by mouth.        Marland Kitchen omeprazole (PRILOSEC) 20 MG capsule TAKE ONE CAPSULE BY MOUTH EVERY DAY  30 capsule  6  . potassium chloride (K-DUR,KLOR-CON) 10 MEQ tablet Take 1  tablet (10 mEq total) by mouth daily.  90 tablet  3  . simvastatin (ZOCOR) 20 MG tablet TAKE ONE TABLET BY MOUTH EVERY DAY  30 tablet  6  . TAZTIA XT 180 MG 24 hr capsule TAKE ONE CAPSULE BY MOUTH EVERY DAY  30 each  11  . warfarin (COUMADIN) 5 MG tablet TAKE AS DIRECTED BY MOUTH  30 tablet  3  . zolpidem (AMBIEN) 5 MG tablet TAKE ONE-HALF TABLET BY MOUTH AS NEEDED AT BEDTIME  30 tablet  5  . denosumab (PROLIA) 60 MG/ML SOLN injection Inject 60 mg into the skin every 6 (six) months. Administer in upper arm, thigh, or abdomen      . diltiazem (DILACOR XR) 180 MG 24 hr capsule Take 180 mg by mouth daily.        Review of Systems:  As above. No fever. Continue joint pains. Fatigue. Decreased appetite.  Physical Examination: Filed Vitals:   01/02/12 1043  BP: 100/60  Pulse: 100  Temp: 97.5 F (36.4 C)   Filed Vitals:    01/02/12 1043  Weight: 121 lb 8 oz (55.112 kg)   There is no height on file to calculate BMI. Ideal Body Weight:    GEN: WDWN, NAD, Non-toxic, A & O x 3 HEENT: Atraumatic, Normocephalic. Neck supple. No masses, No LAD. Ears and Nose: No external deformity. CV: irreg, irreg PULM: CTA B, no wheezes, crackles, rhonchi. No retractions. No resp. distress. No accessory muscle use. ABD: S, NT, ND, hyperactiveBS. No rebound. No HSM. EXTR: No c/c/e NEURO Normal gait.  PSYCH: Normally interactive. Conversant. Not depressed or anxious appearing.  Calm demeanor.    Assessment and Plan:  1. Chest pain  EKG 12-Lead  2. Gastroenteritis    3. PAROXYSMAL ATRIAL FIBRILLATION     EKG: Consistent with atrial fibrillation. No acute ST elevation or depression apparent  Most likely secondary to acute viral infection, and she is improving. Continue to push fluids and gentle food. She is currently in A. fib.  Orders Today:  Orders Placed This Encounter  Procedures  . EKG 12-Lead    Medications Today: (Includes new updates added during medication reconciliation) No orders of the defined types were placed in this encounter.     [name], MD

## 2012-01-02 NOTE — Telephone Encounter (Signed)
Pt vomiting and some diarrhea all night;ate ham & cheese sandwich, pimento cheese and cookies yesterday. this AM tightness and dull chest pain with slight SOB. Hx of atrial fib since teenager. Dr Patsy Lager advised will see this AM; pt to come now.Pts son is with her and understands will come to office now.

## 2012-01-02 NOTE — Patient Instructions (Signed)
Hold x 3 days then  2.5mg  daily 1 week

## 2012-01-03 ENCOUNTER — Ambulatory Visit: Payer: Medicare Other

## 2012-01-04 ENCOUNTER — Ambulatory Visit: Payer: Medicare Other

## 2012-01-09 ENCOUNTER — Ambulatory Visit (INDEPENDENT_AMBULATORY_CARE_PROVIDER_SITE_OTHER): Payer: Medicare Other | Admitting: Family Medicine

## 2012-01-09 DIAGNOSIS — I4891 Unspecified atrial fibrillation: Secondary | ICD-10-CM

## 2012-01-09 DIAGNOSIS — Z5181 Encounter for therapeutic drug level monitoring: Secondary | ICD-10-CM

## 2012-01-09 DIAGNOSIS — Z7901 Long term (current) use of anticoagulants: Secondary | ICD-10-CM

## 2012-01-09 NOTE — Patient Instructions (Signed)
Continue current dose, check in 2 weeks  

## 2012-01-11 ENCOUNTER — Other Ambulatory Visit: Payer: Self-pay | Admitting: Oncology

## 2012-01-11 DIAGNOSIS — Z853 Personal history of malignant neoplasm of breast: Secondary | ICD-10-CM

## 2012-01-24 ENCOUNTER — Ambulatory Visit: Payer: Medicare Other

## 2012-01-26 ENCOUNTER — Ambulatory Visit (INDEPENDENT_AMBULATORY_CARE_PROVIDER_SITE_OTHER): Payer: Medicare Other | Admitting: Family Medicine

## 2012-01-26 ENCOUNTER — Other Ambulatory Visit: Payer: Self-pay | Admitting: Family Medicine

## 2012-01-26 DIAGNOSIS — Z5181 Encounter for therapeutic drug level monitoring: Secondary | ICD-10-CM

## 2012-01-26 DIAGNOSIS — I4891 Unspecified atrial fibrillation: Secondary | ICD-10-CM

## 2012-01-26 DIAGNOSIS — Z7901 Long term (current) use of anticoagulants: Secondary | ICD-10-CM

## 2012-01-26 LAB — POCT INR: INR: 2

## 2012-01-26 NOTE — Patient Instructions (Signed)
Continue 2.5 mg daily, recheck 4 weeks 

## 2012-01-27 ENCOUNTER — Telehealth: Payer: Self-pay | Admitting: Family Medicine

## 2012-01-27 NOTE — Telephone Encounter (Signed)
Received refill request from [facility] requesting refill for Flexeril 10mg  . Please advise Thanks

## 2012-01-31 ENCOUNTER — Other Ambulatory Visit: Payer: Self-pay | Admitting: Family Medicine

## 2012-01-31 ENCOUNTER — Ambulatory Visit
Admission: RE | Admit: 2012-01-31 | Discharge: 2012-01-31 | Disposition: A | Discharge status: Home / Self Care | Payer: Medicare Other | Source: Ambulatory Visit | Attending: Oncology | Admitting: Oncology

## 2012-01-31 DIAGNOSIS — Z853 Personal history of malignant neoplasm of breast: Secondary | ICD-10-CM

## 2012-01-31 NOTE — Telephone Encounter (Signed)
Can you find out what is going on with this patient. Has not been on this for at least 18 months in the EMR. More information needed to assess - if some significant change, then happy to recheck in the office

## 2012-01-31 NOTE — Telephone Encounter (Signed)
Spoke with patient and she went to [facility] for the tremors and they gave her 20 tablets 12/21/11 and per pt they helped she doesn't take them everyday and just want them on hand just in case, pt states she doesn't need a refill now but will call when she gets down to 2 tablets and right now she has 16 tablets.

## 2012-02-01 NOTE — Telephone Encounter (Signed)
noted 

## 2012-02-07 ENCOUNTER — Other Ambulatory Visit: Payer: Self-pay | Admitting: Family Medicine

## 2012-02-07 NOTE — Telephone Encounter (Signed)
OK to refill

## 2012-02-08 NOTE — Telephone Encounter (Signed)
#  15, 4 refills  Please refill

## 2012-02-08 NOTE — Telephone Encounter (Signed)
Rx sent to pharmacy   

## 2012-02-22 ENCOUNTER — Other Ambulatory Visit: Payer: Self-pay

## 2012-02-22 ENCOUNTER — Ambulatory Visit (INDEPENDENT_AMBULATORY_CARE_PROVIDER_SITE_OTHER): Payer: Medicare Other | Admitting: Family Medicine

## 2012-02-22 DIAGNOSIS — Z5181 Encounter for therapeutic drug level monitoring: Secondary | ICD-10-CM

## 2012-02-22 DIAGNOSIS — I4891 Unspecified atrial fibrillation: Secondary | ICD-10-CM

## 2012-02-22 DIAGNOSIS — Z7901 Long term (current) use of anticoagulants: Secondary | ICD-10-CM

## 2012-02-22 LAB — POCT INR: INR: 1.2

## 2012-02-22 MED ORDER — ZOLPIDEM TARTRATE 5 MG PO TABS: 5.0000 mg | ORAL_TABLET | Freq: Every evening | ORAL | Status: DC | PRN

## 2012-02-22 NOTE — Telephone Encounter (Signed)
Ok to refill 5 mg, #15, 2 refills

## 2012-02-22 NOTE — Patient Instructions (Signed)
Take 5 mg today and tomorrow, then resume  2.5mg  daily, recheck 1 week

## 2012-02-22 NOTE — Telephone Encounter (Signed)
[facility] request new Rx refill for Ambien 5 mg. #15 Ok to refill?

## 2012-02-23 ENCOUNTER — Ambulatory Visit: Payer: Medicare Other

## 2012-03-02 ENCOUNTER — Ambulatory Visit (INDEPENDENT_AMBULATORY_CARE_PROVIDER_SITE_OTHER): Payer: Medicare Other | Admitting: Family Medicine

## 2012-03-02 DIAGNOSIS — Z7901 Long term (current) use of anticoagulants: Secondary | ICD-10-CM

## 2012-03-02 DIAGNOSIS — Z5181 Encounter for therapeutic drug level monitoring: Secondary | ICD-10-CM

## 2012-03-02 DIAGNOSIS — I4891 Unspecified atrial fibrillation: Secondary | ICD-10-CM

## 2012-03-02 NOTE — Patient Instructions (Addendum)
Continue 2.5 mg daily, recheck 4 weeks 

## 2012-03-13 ENCOUNTER — Ambulatory Visit (INDEPENDENT_AMBULATORY_CARE_PROVIDER_SITE_OTHER): Payer: Medicare Other | Admitting: General Surgery

## 2012-03-13 ENCOUNTER — Encounter (INDEPENDENT_AMBULATORY_CARE_PROVIDER_SITE_OTHER): Payer: Self-pay | Admitting: General Surgery

## 2012-03-13 VITALS — BP 110/64 | HR 62 | Temp 97.8°F | Resp 12 | Ht 59.0 in | Wt 123.4 lb

## 2012-03-13 DIAGNOSIS — Z853 Personal history of malignant neoplasm of breast: Secondary | ICD-10-CM

## 2012-03-13 NOTE — Progress Notes (Signed)
Operation: Left partial mastectomy  Date: March 26, 2010  Stage: T1cNx  Hormone receptor status: Positive  HPI: Ms. Whittenberg is here for followup of left breast cancer. She denies lumps in either breast.  No reported adenopathy in the cervical or axillary area.  Still taking Arimidex.  Mammogram 01/2012 showed no evidence of malignancy.  PE: Gen.-elderly female in no acute distress.  Right breast-soft, no palpable masses, suspicious skin changes, or nipple discharge.  Left breast-upper outer quadrant scar well healed; no palpable masses present.  Lymph nodes-no palpable cervical, supraclavicular, or axillary adenopathy.  Assessment: Hormone receptor positive left breast cancer status post partial mastectomy-no clinical evidence of recurrence.  Plan:  Return visit 6 months.

## 2012-03-13 NOTE — Patient Instructions (Signed)
Call us if you feel any new masses in your breasts.

## 2012-04-03 ENCOUNTER — Ambulatory Visit (INDEPENDENT_AMBULATORY_CARE_PROVIDER_SITE_OTHER): Payer: Medicare Other | Admitting: Family Medicine

## 2012-04-03 DIAGNOSIS — Z7901 Long term (current) use of anticoagulants: Secondary | ICD-10-CM

## 2012-04-03 DIAGNOSIS — I4891 Unspecified atrial fibrillation: Secondary | ICD-10-CM

## 2012-04-03 DIAGNOSIS — Z5181 Encounter for therapeutic drug level monitoring: Secondary | ICD-10-CM

## 2012-04-03 LAB — POCT INR: INR: 1.6

## 2012-04-03 NOTE — Patient Instructions (Signed)
Take 5 mg today, then back on 2.5 mg daily, 1 week

## 2012-04-04 ENCOUNTER — Telehealth: Payer: Self-pay

## 2012-04-04 NOTE — Telephone Encounter (Signed)
Constipation start 2-3 weeks ago on and off; last normal BM 1 week ago,04/03/12 pt had small constipated BM. Pt has taken Duralax and increased fiber and fruit in diet. Wants suggestion of med can take to rid constipation; [employer].

## 2012-04-04 NOTE — Telephone Encounter (Signed)
CONSTIPATION  1. Warm prune juice, hot water, tea, coffee, apple juice 2. Prevention: drink 8 glasses water daily, FIBER (raw fruit, veggies, bran cereal, whole grains), regular exercise 4. Stool softeners (Docusate) TAKE TWICE A DAY 5.  Miralax usually safe - TWICE A DAY UNTIL BM

## 2012-04-04 NOTE — Telephone Encounter (Signed)
Patient advised.

## 2012-04-10 ENCOUNTER — Ambulatory Visit (INDEPENDENT_AMBULATORY_CARE_PROVIDER_SITE_OTHER): Payer: Medicare Other | Admitting: Family Medicine

## 2012-04-10 DIAGNOSIS — Z7901 Long term (current) use of anticoagulants: Secondary | ICD-10-CM

## 2012-04-10 DIAGNOSIS — I4891 Unspecified atrial fibrillation: Secondary | ICD-10-CM

## 2012-04-10 DIAGNOSIS — Z5181 Encounter for therapeutic drug level monitoring: Secondary | ICD-10-CM

## 2012-04-10 LAB — POCT INR: INR: 2

## 2012-04-10 NOTE — Patient Instructions (Signed)
2.5 mg daily except, 5 mg Wednesday's

## 2012-04-11 ENCOUNTER — Encounter: Payer: Medicare Other | Admitting: Occupational Therapy

## 2012-04-13 ENCOUNTER — Encounter: Payer: Medicare Other | Admitting: Occupational Therapy

## 2012-04-18 ENCOUNTER — Encounter: Payer: Medicare Other | Admitting: Occupational Therapy

## 2012-04-19 ENCOUNTER — Ambulatory Visit: Payer: Medicare Other | Attending: Ophthalmology | Admitting: Occupational Therapy

## 2012-04-19 DIAGNOSIS — H53419 Scotoma involving central area, unspecified eye: Secondary | ICD-10-CM | POA: Insufficient documentation

## 2012-04-19 DIAGNOSIS — H3530 Unspecified macular degeneration: Secondary | ICD-10-CM | POA: Insufficient documentation

## 2012-04-19 DIAGNOSIS — IMO0001 Reserved for inherently not codable concepts without codable children: Secondary | ICD-10-CM | POA: Insufficient documentation

## 2012-04-23 ENCOUNTER — Other Ambulatory Visit: Payer: Self-pay | Admitting: Family Medicine

## 2012-05-08 ENCOUNTER — Ambulatory Visit: Payer: Medicare Other

## 2012-05-10 ENCOUNTER — Ambulatory Visit (INDEPENDENT_AMBULATORY_CARE_PROVIDER_SITE_OTHER): Payer: Medicare Other | Admitting: General Practice

## 2012-05-10 DIAGNOSIS — I4891 Unspecified atrial fibrillation: Secondary | ICD-10-CM

## 2012-05-10 LAB — POCT INR: INR: 2.5

## 2012-05-24 ENCOUNTER — Other Ambulatory Visit: Payer: Self-pay | Admitting: Oncology

## 2012-05-24 ENCOUNTER — Other Ambulatory Visit: Payer: Self-pay | Admitting: Family Medicine

## 2012-05-24 DIAGNOSIS — C50919 Malignant neoplasm of unspecified site of unspecified female breast: Secondary | ICD-10-CM

## 2012-06-04 ENCOUNTER — Ambulatory Visit (INDEPENDENT_AMBULATORY_CARE_PROVIDER_SITE_OTHER): Payer: Medicare Other | Admitting: General Practice

## 2012-06-04 DIAGNOSIS — I4891 Unspecified atrial fibrillation: Secondary | ICD-10-CM

## 2012-06-07 ENCOUNTER — Ambulatory Visit: Payer: Medicare Other

## 2012-06-24 ENCOUNTER — Other Ambulatory Visit: Payer: Self-pay | Admitting: Family Medicine

## 2012-06-27 ENCOUNTER — Ambulatory Visit: Payer: Medicare Other | Admitting: Physician Assistant

## 2012-07-02 ENCOUNTER — Ambulatory Visit (INDEPENDENT_AMBULATORY_CARE_PROVIDER_SITE_OTHER): Payer: Medicare Other | Admitting: General Practice

## 2012-07-02 DIAGNOSIS — I4891 Unspecified atrial fibrillation: Secondary | ICD-10-CM

## 2012-07-02 LAB — POCT INR: INR: 3.5

## 2012-07-03 ENCOUNTER — Ambulatory Visit (INDEPENDENT_AMBULATORY_CARE_PROVIDER_SITE_OTHER): Payer: Medicare Other | Admitting: Physician Assistant

## 2012-07-03 ENCOUNTER — Encounter: Payer: Self-pay | Admitting: Physician Assistant

## 2012-07-03 VITALS — BP 126/68 | HR 100 | Ht 59.0 in | Wt 117.1 lb

## 2012-07-03 DIAGNOSIS — E039 Hypothyroidism, unspecified: Secondary | ICD-10-CM

## 2012-07-03 DIAGNOSIS — I10 Essential (primary) hypertension: Secondary | ICD-10-CM

## 2012-07-03 DIAGNOSIS — R0602 Shortness of breath: Secondary | ICD-10-CM

## 2012-07-03 DIAGNOSIS — I4891 Unspecified atrial fibrillation: Secondary | ICD-10-CM

## 2012-07-03 MED ORDER — METOPROLOL SUCCINATE ER 50 MG PO TB24: 50.0000 mg | ORAL_TABLET | Freq: Every day | ORAL | Status: DC

## 2012-07-03 NOTE — Progress Notes (Signed)
[address] [place], [place]  [address] Phone: [phone], Fax:  [phone]  Date:  07/03/2012   ID:  [name] [name], DOB August 24, 1923, MRN [id number]  PCP:  [name], MD  Primary Cardiologist:  Dr. Dietrich Pates     History of Present Illness: [name] [name] is a 79 y.o. female who returns for followup.  She has a hx of parox AFib, HTN, HL, hypothyroidism.  Echo 4/08: EF 65%, mild AI, mild MAC, mild LAE.  Last seen by Dr. Dietrich Pates in 07/2011.  She recently developed some left hip pain. She noted increased dyspnea with exertion associated with this. She had an injection which relieved her hip pain. Her breathing has been better since. She describes NYHA class II-IIb symptoms. She denies orthopnea, PND or edema. She denies chest pain. She denies syncope. She denies cough.  Labs (4/13):  K 3.4, creatinine 1.0, ALT 15, LDL 74, Hgb 11.8, TSH 1.53 Labs (5/13):  K 3.9, creatinine 1.2  Wt Readings from Last 3 Encounters:  03/13/12 123 lb 6.4 oz (55.974 kg)  01/02/12 121 lb 8 oz (55.112 kg)  11/10/11 124 lb 8 oz (56.473 kg)     Past Medical History  Diagnosis Date  . Hypertension   . Heart murmur   . Gout   . Hypothyroidism   . Afib   . Muscular degeneration   . Breast cancer, left breast   . Arthritis   . Peptic ulcer disease   . Anemia   . Hyperlipidemia   . Cancer     Left breast  . Renal insufficiency 10/12/2011    Current Outpatient Prescriptions  Medication Sig Dispense Refill  . allopurinol (ZYLOPRIM) 300 MG tablet TAKE ONE TABLET BY MOUTH EVERY DAY  30 tablet  0  . anastrozole (ARIMIDEX) 1 MG tablet TAKE ONE TABLET BY MOUTH EVERY DAY  30 tablet  5  . diltiazem (DILACOR XR) 180 MG 24 hr capsule Take 180 mg by mouth daily.      Marland Kitchen levothyroxine (SYNTHROID, LEVOTHROID) 50 MCG tablet TAKE ONE TABLET BY MOUTH EVERY DAY  30 tablet  11  . lisinopril-hydrochlorothiazide (PRINZIDE,ZESTORETIC) 20-12.5 MG per tablet TAKE ONE TABLET BY MOUTH EVERY DAY  30  tablet  11  . metoprolol succinate (TOPROL-XL) 25 MG 24 hr tablet TAKE ONE TABLET BY MOUTH EVERY DAY  30 tablet  0  . Multiple Vitamins-Minerals (EYE-VITE EXTRA PO) Take by mouth.        Marland Kitchen omeprazole (PRILOSEC) 20 MG capsule TAKE ONE CAPSULE BY MOUTH EVERY DAY  30 capsule  0  . potassium chloride (K-DUR,KLOR-CON) 10 MEQ tablet Take 1 tablet (10 mEq total) by mouth daily.  90 tablet  3  . simvastatin (ZOCOR) 20 MG tablet TAKE ONE TABLET BY MOUTH EVERY DAY  30 tablet  0  . TAZTIA XT 180 MG 24 hr capsule TAKE ONE CAPSULE BY MOUTH EVERY DAY  30 each  11  . warfarin (COUMADIN) 5 MG tablet TAKE AS DIRECTED BY MOUTH  30 tablet  3  . zolpidem (AMBIEN) 5 MG tablet Take 1 tablet (5 mg total) by mouth at bedtime as needed for sleep.  15 tablet  4    Allergies:   No Known Allergies  Social History:  The patient  reports that she has never smoked. She has never used smokeless tobacco. She reports that she does not drink alcohol or use illicit drugs.   ROS:  Please see the history of  present illness.   She denies melena or hematochezia, vomiting, diarrhea, fevers, chills.   All other systems reviewed and negative.   PHYSICAL EXAM: VS:  BP 126/68  Pulse 100  Ht 4\' 11"  (1.499 m)  Wt 117 lb 1.9 oz (53.125 kg)  BMI 23.66 kg/m2 Well nourished, well developed, in no acute distress HEENT: normal Neck: no JVD Cardiac:  normal S1, S2; irregularly irregular rhythm; no murmur Lungs:  clear to auscultation bilaterally, no wheezing, rhonchi or rales Abd: soft, nontender, no hepatomegaly Ext: Trace to 1+ bilateral ankle edema Skin: warm and dry Neuro:  CNs 2-12 intact, no focal abnormalities noted  EKG:  Atrial fibrillation, HR 100, normal axis, diffuse nonspecific ST-T wave changes, no change from prior tracing     ASSESSMENT AND PLAN:  1. Atrial Fibrillation:  This is recurrent. It is not entirely clear whether or not she is symptomatic from this. She did have dyspnea with exertion. However, this did  improve after relief of her hip pain. She does have some pedal edema. Her rate is marginally controlled. She has had several therapeutic INR is over the last several months. We had a long discussion regarding rate versus rhythm control. I will increase her Toprol to 50 mg daily. Arrange an echocardiogram to reassess LV function. Check a basic metabolic panel, BNP, CBC and TSH today. She will followup in 2 weeks. If her atria are not significantly enlarged and she appears to be symptomatic from atrial fibrillation, consider proceeding with cardioversion. 2. Dyspnea:  As noted, proceed with echocardiogram. Obtain a BNP. If this is significantly elevated, start Lasix. 3. Hypertension:  Controlled.  4. Hypothyroidism:  Check TSH.  5. Disposition: Followup with Dr. Tenny Craw or me in 2 weeks.  Signed, Tereso Newcomer, PA-C  3:47 PM 07/03/2012

## 2012-07-03 NOTE — Patient Instructions (Addendum)
INCREASE TOPROL TO 50 MG DAILY; NEW RX SENT IN TODAY TO [facility] [address]  LAB TODAY; BMET, CBC W/DIFF, TSH, BNP  PLEASE SCHEDULE AN ECHO DX 786.05, 427.31  PLEASE SCHEDULE A FOLLOW UP APPT WITH EITHER DR. ROSS OR SCOTT WEAVER, PAC SAME DAY DR. Tenny Craw IS IN THE OFFICE

## 2012-07-04 ENCOUNTER — Telehealth: Payer: Self-pay | Admitting: Internal Medicine

## 2012-07-04 LAB — CBC WITH DIFFERENTIAL/PLATELET
Basophils Absolute: 0.1 10*3/uL (ref 0.0–0.1)
Basophils Relative: 1 % (ref 0.0–3.0)
Hemoglobin: 11.3 g/dL — ABNORMAL LOW (ref 12.0–15.0)
Lymphocytes Relative: 19 % (ref 12.0–46.0)
Monocytes Relative: 4 % (ref 3.0–12.0)
Neutro Abs: 8.6 10*3/uL — ABNORMAL HIGH (ref 1.4–7.7)
RBC: 4.15 Mil/uL (ref 3.87–5.11)
RDW: 19 % — ABNORMAL HIGH (ref 11.5–14.6)

## 2012-07-04 LAB — BASIC METABOLIC PANEL
Calcium: 9.1 mg/dL (ref 8.4–10.5)
GFR: 39.43 mL/min — ABNORMAL LOW (ref >60.00)
Sodium: 139 mEq/L (ref 135–145)

## 2012-07-04 LAB — BRAIN NATRIURETIC PEPTIDE: Pro B Natriuretic peptide (BNP): 365 pg/mL — ABNORMAL HIGH (ref 0.0–100.0)

## 2012-07-04 NOTE — Telephone Encounter (Signed)
Spoke with pt

## 2012-07-04 NOTE — Telephone Encounter (Signed)
Reviewed with Brynda Rim. He recommended pt take Toprol XL 25mg  two times a day instead of Toprol XL 50mg  daily. I spoke with pt. Pt states she feels better this afternoon. She will take Toprol XL 25mg  bid instead of Toprol XL 50gm daily. She will call back if symptoms do not continue to improve.

## 2012-07-04 NOTE — Telephone Encounter (Signed)
Pt was here yesterday and it was a change in her medication and she is really light headed today and needs to know what to do

## 2012-07-04 NOTE — Telephone Encounter (Signed)
Pt states Toprol XL was increased to 50mg  yesterday. She took 50mg  today for the first time. She has intermittent lightheadedness today. She does not associate it with any particular activity or change of position. She does not feel like she is going to pass out.  She does not know her heart rate or BP and does not have a way to get them checked. Pt states she is staying well hydrated.  I will forward to [facility] for review.

## 2012-07-05 ENCOUNTER — Encounter: Payer: Self-pay | Admitting: Internal Medicine

## 2012-07-05 NOTE — Telephone Encounter (Signed)
Agree Tereso Newcomer, PA-C  8:27 AM 07/05/2012

## 2012-07-12 ENCOUNTER — Other Ambulatory Visit (HOSPITAL_COMMUNITY): Payer: Medicare Other

## 2012-07-19 ENCOUNTER — Ambulatory Visit (INDEPENDENT_AMBULATORY_CARE_PROVIDER_SITE_OTHER): Payer: Medicare Other | Admitting: General Practice

## 2012-07-19 DIAGNOSIS — Z5181 Encounter for therapeutic drug level monitoring: Secondary | ICD-10-CM

## 2012-07-19 DIAGNOSIS — I4891 Unspecified atrial fibrillation: Secondary | ICD-10-CM

## 2012-07-19 DIAGNOSIS — Z7901 Long term (current) use of anticoagulants: Secondary | ICD-10-CM

## 2012-07-19 LAB — POCT INR: INR: 3.1

## 2012-07-23 ENCOUNTER — Ambulatory Visit (HOSPITAL_COMMUNITY): Payer: Medicare Other | Attending: Cardiovascular Disease | Admitting: Radiology

## 2012-07-23 ENCOUNTER — Encounter: Payer: Self-pay | Admitting: Physician Assistant

## 2012-07-23 ENCOUNTER — Telehealth: Payer: Self-pay | Admitting: *Deleted

## 2012-07-23 DIAGNOSIS — I10 Essential (primary) hypertension: Secondary | ICD-10-CM | POA: Insufficient documentation

## 2012-07-23 DIAGNOSIS — R0602 Shortness of breath: Secondary | ICD-10-CM

## 2012-07-23 DIAGNOSIS — I080 Rheumatic disorders of both mitral and aortic valves: Secondary | ICD-10-CM | POA: Insufficient documentation

## 2012-07-23 DIAGNOSIS — E039 Hypothyroidism, unspecified: Secondary | ICD-10-CM | POA: Insufficient documentation

## 2012-07-23 DIAGNOSIS — I4891 Unspecified atrial fibrillation: Secondary | ICD-10-CM

## 2012-07-23 DIAGNOSIS — E785 Hyperlipidemia, unspecified: Secondary | ICD-10-CM | POA: Insufficient documentation

## 2012-07-23 DIAGNOSIS — I079 Rheumatic tricuspid valve disease, unspecified: Secondary | ICD-10-CM | POA: Insufficient documentation

## 2012-07-23 NOTE — Progress Notes (Signed)
Echocardiogram performed.  

## 2012-07-23 NOTE — Telephone Encounter (Signed)
PT notified of normal Echo, with verbal understanding.

## 2012-07-23 NOTE — Telephone Encounter (Signed)
Message copied by Tarri Fuller on Mon Jul 23, 2012  5:03 PM ------      Message from: [place], [place] T      Created: Mon Jul 23, 2012  4:41 PM       EF normal.      Tereso Newcomer, PA-C  4:41 PM 07/23/2012 ------

## 2012-07-25 ENCOUNTER — Other Ambulatory Visit: Payer: Self-pay | Admitting: Family Medicine

## 2012-08-09 ENCOUNTER — Encounter: Payer: Self-pay | Admitting: Physician Assistant

## 2012-08-09 ENCOUNTER — Other Ambulatory Visit: Payer: Self-pay

## 2012-08-09 ENCOUNTER — Ambulatory Visit (INDEPENDENT_AMBULATORY_CARE_PROVIDER_SITE_OTHER): Payer: Medicare Other | Admitting: Physician Assistant

## 2012-08-09 ENCOUNTER — Ambulatory Visit (INDEPENDENT_AMBULATORY_CARE_PROVIDER_SITE_OTHER): Payer: Medicare Other

## 2012-08-09 VITALS — BP 117/67 | HR 64 | Ht <= 58 in | Wt 117.0 lb

## 2012-08-09 DIAGNOSIS — R0602 Shortness of breath: Secondary | ICD-10-CM

## 2012-08-09 DIAGNOSIS — I4891 Unspecified atrial fibrillation: Secondary | ICD-10-CM

## 2012-08-09 DIAGNOSIS — Z7901 Long term (current) use of anticoagulants: Secondary | ICD-10-CM

## 2012-08-09 DIAGNOSIS — R609 Edema, unspecified: Secondary | ICD-10-CM

## 2012-08-09 DIAGNOSIS — I10 Essential (primary) hypertension: Secondary | ICD-10-CM

## 2012-08-09 DIAGNOSIS — Z5181 Encounter for therapeutic drug level monitoring: Secondary | ICD-10-CM

## 2012-08-09 MED ORDER — POTASSIUM CHLORIDE CRYS ER 10 MEQ PO TBCR: EXTENDED_RELEASE_TABLET | ORAL | Status: DC

## 2012-08-09 MED ORDER — FUROSEMIDE 20 MG PO TABS: ORAL_TABLET | ORAL | Status: DC

## 2012-08-09 MED ORDER — CYCLOBENZAPRINE HCL 10 MG PO TABS: 10.0000 mg | ORAL_TABLET | Freq: Three times a day (TID) | ORAL | Status: DC | PRN

## 2012-08-09 NOTE — Progress Notes (Signed)
[address] [place], [place]  [address] Phone: [phone], Fax:  [phone]  Date:  08/09/2012   ID:  [name] [name], DOB 12/19/1923, MRN [id number]  PCP:  [name], MD  Primary Cardiologist:  Dr. Dietrich Pates     History of Present Illness: [name] [name] is a 79 y.o. female who returns for followup.  She has a hx of parox AFib, HTN, HL, hypothyroidism.  Echo 4/08: EF 65%, mild AI, mild MAC, mild LAE.  I saw her 07/03/12. She recently had some worsening shortness of breath improved after an injection in her hip for left hip pain. ECG demonstrated recurrent atrial fibrillation with heart rate 100. I increased her Toprol.  Echocardiogram demonstrated normal LV function with moderate to severe LAE and mild RAE.  She continues to have dyspnea with more extreme activities at times.  She is probably NYHA Class IIb-III.  No orthopnea, PND.  She has LE edema.  No chest pain, palpitations, syncope.    Labs (4/13):  K 3.4, creatinine 1.0, ALT 15, LDL 74, Hgb 11.8, TSH 1.53 Labs (5/13):  K 3.9, creatinine 1.2 Labs (2/14):  K 4, BUN 29, creatinine 1.4, Hgb 11.3, TSH 1.33, BNP 365  Wt Readings from Last 3 Encounters:  08/09/12 117 lb (53.071 kg)  07/03/12 117 lb 1.9 oz (53.125 kg)  03/13/12 123 lb 6.4 oz (55.974 kg)     Past Medical History  Diagnosis Date  . Hypertension   . Heart murmur   . Gout   . Hypothyroidism   . Afib   . Muscular degeneration   . Breast cancer, left breast   . Arthritis   . Peptic ulcer disease   . Anemia   . Hyperlipidemia   . Cancer     Left breast  . Renal insufficiency 10/12/2011  . Hx of echocardiogram     a. Echo 2/14:  EF 60-65%, mild AI, MAC, mild MR, mod to severe LAE, mild RAE, PASP 34    Current Outpatient Prescriptions  Medication Sig Dispense Refill  . allopurinol (ZYLOPRIM) 300 MG tablet TAKE ONE TABLET BY MOUTH EVERY DAY  30 tablet  0  . anastrozole (ARIMIDEX) 1 MG tablet TAKE ONE TABLET BY MOUTH EVERY DAY  30  tablet  5  . levothyroxine (SYNTHROID, LEVOTHROID) 50 MCG tablet TAKE ONE TABLET BY MOUTH EVERY DAY  30 tablet  11  . lisinopril-hydrochlorothiazide (PRINZIDE,ZESTORETIC) 20-12.5 MG per tablet TAKE ONE TABLET BY MOUTH EVERY DAY  30 tablet  0  . metoprolol succinate (TOPROL XL) 25 MG 24 hr tablet Take 1 tablet (25 mg total) by mouth 2 (two) times daily.      . Multiple Vitamins-Minerals (EYE-VITE EXTRA PO) Take by mouth.        Marland Kitchen omeprazole (PRILOSEC) 20 MG capsule TAKE ONE CAPSULE BY MOUTH EVERY DAY  30 capsule  0  . potassium chloride (K-DUR,KLOR-CON) 10 MEQ tablet Take 1 tablet (10 mEq total) by mouth daily.  90 tablet  3  . simvastatin (ZOCOR) 20 MG tablet TAKE ONE TABLET BY MOUTH EVERY DAY  30 tablet  0  . TAZTIA XT 180 MG 24 hr capsule TAKE ONE CAPSULE BY MOUTH EVERY DAY  30 each  11  . traMADol (ULTRAM) 50 MG tablet Take 50 mg by mouth every 6 (six) hours as needed.      . warfarin (COUMADIN) 5 MG tablet TAKE AS DIRECTED BY MOUTH  30 tablet  0  . zolpidem (  AMBIEN) 5 MG tablet Take 1 tablet (5 mg total) by mouth at bedtime as needed for sleep.  15 tablet  4   No current facility-administered medications for this visit.    Allergies:   No Known Allergies  Social History:  The patient  reports that she has never smoked. She has never used smokeless tobacco. She reports that she does not drink alcohol or use illicit drugs.   ROS:  Please see the history of present illness.    All other systems reviewed and negative.   PHYSICAL EXAM: VS:  BP 117/67  Pulse 64  Ht 4\' 9"  (1.448 m)  Wt 117 lb (53.071 kg)  BMI 25.31 kg/m2 Well nourished, well developed, in no acute distress HEENT: normal Neck: minimally elevated JVD at 90 degrees Cardiac:  normal S1, S2; RRR; no murmur Lungs:  clear to auscultation bilaterally, no wheezing, rhonchi or rales Abd: soft, nontender  Ext: 1-2+ bilateral LE edema, R>L Skin: warm and dry Neuro:  CNs 2-12 intact, no focal abnormalities noted  EKG:  AFib, HR  70   ASSESSMENT AND PLAN:  1. Atrial Fibrillation:  She is probably somewhat symptomatic from her AFib, but this is not entirely clear.  I reviewed her echo with Dr. Tenny Craw.  Her LA is fairly large and she may not hold NSR.  INRs have been therapeutic.  INR in our office today is 2.4.  We will attempt DCCV to restore NSR.  If her symptoms of fatigue and DOE improve with NSR, then we would pursue a strategy of rhythm control with amiodarone (if she has recurrent AFib).  If her symptoms do not change, then we would continue a strategy of rate control.   2. Edema:  I suspect she has an element of diastolic CHF possibly made worse by her AFib.  Will add very low dose Lasix.  She will take extra K+ on Lasix days.  Check repeat bmet in 1 week. 3. Hypertension:  Controlled.  4. Disposition:  Followup with Dr. Tenny Craw can be arranged after her DCCV.  Luna Glasgow, PA-C  4:09 PM 08/09/2012

## 2012-08-09 NOTE — Patient Instructions (Addendum)
Your physician has recommended you make the following change in your medication: take Furosemide 20 mg for 3 days then take 1 tablet on Mondays, Wednesdays and Fridays and take an extra Potassium on days that you take Furosemide. (you will take 1 Potassium tablet daily except on days that you take Furosemide you will take 2 tablets of Potassium)  Your physician recommends that you return for lab work on: 3/20-next Thursday and Remember to have Coumadin checked on Monday 3/17  Your physician recommends that you schedule a follow-up appointment in: 2 to 3 weeks after cardioversion. I will contact you tomorrow with date, time and instructions for cardioversion.

## 2012-08-10 ENCOUNTER — Telehealth: Payer: Self-pay

## 2012-08-10 ENCOUNTER — Other Ambulatory Visit: Payer: Self-pay

## 2012-08-10 DIAGNOSIS — N289 Disorder of kidney and ureter, unspecified: Secondary | ICD-10-CM

## 2012-08-10 MED ORDER — POTASSIUM CHLORIDE CRYS ER 10 MEQ PO TBCR: EXTENDED_RELEASE_TABLET | ORAL | Status: DC

## 2012-08-10 NOTE — Addendum Note (Signed)
Addended by: Demetrios Loll on: 08/10/2012 08:39 AM   Modules accepted: Orders

## 2012-08-10 NOTE — Telephone Encounter (Signed)
Called pt with date and time of cardioversion and went over instructions with her, she verbalized understanding. Also, pt is aware that her cardioversion instruction letter with f/u appt information has been left in the Coumadin clinic for her to receive when she has Coumadin level checked on Monday.

## 2012-08-13 ENCOUNTER — Telehealth: Payer: Self-pay | Admitting: Internal Medicine

## 2012-08-13 ENCOUNTER — Other Ambulatory Visit: Payer: Self-pay | Admitting: Physician Assistant

## 2012-08-13 DIAGNOSIS — I4891 Unspecified atrial fibrillation: Secondary | ICD-10-CM

## 2012-08-13 NOTE — Telephone Encounter (Signed)
Will route to dr/nurse 

## 2012-08-13 NOTE — H&P (Signed)
History and Physical  Date: 08/09/2012   ID: CECILLIA [name], DOB Sep 10, 1923, MRN [id number]   PCP: [name], MD  Primary Cardiologist: Dr. Dietrich Pates   History of Present Illness:  [name] [name] is a 79 y.o. female who returns for followup.  She has a hx of parox AFib, HTN, HL, hypothyroidism. Echo 4/08: EF 65%, mild AI, mild MAC, mild LAE. I saw her 07/03/12. She recently had some worsening shortness of breath improved after an injection in her hip for left hip pain. ECG demonstrated recurrent atrial fibrillation with heart rate 100. I increased her Toprol. Echocardiogram demonstrated normal LV function with moderate to severe LAE and mild RAE. She continues to have dyspnea with more extreme activities at times. She is probably NYHA Class IIb-III. No orthopnea, PND. She has LE edema. No chest pain, palpitations, syncope.   Labs (4/13): K 3.4, creatinine 1.0, ALT 15, LDL 74, Hgb 11.8, TSH 1.53  Labs (5/13): K 3.9, creatinine 1.2  Labs (2/14): K 4, BUN 29, creatinine 1.4, Hgb 11.3, TSH 1.33, BNP 365    Wt Readings from Last 3 Encounters:   08/09/12  117 lb (53.071 kg)   07/03/12  117 lb 1.9 oz (53.125 kg)   03/13/12  123 lb 6.4 oz (55.974 kg)     Past Medical History   Diagnosis  Date   .  Hypertension    .  Heart murmur    .  Gout    .  Hypothyroidism    .  Afib    .  Muscular degeneration    .  Breast cancer, left breast    .  Arthritis    .  Peptic ulcer disease    .  Anemia    .  Hyperlipidemia    .  Cancer      Left breast   .  Renal insufficiency  10/12/2011   .  Hx of echocardiogram      a. Echo 2/14: EF 60-65%, mild AI, MAC, mild MR, mod to severe LAE, mild RAE, PASP 34     Current Outpatient Prescriptions   Medication  Sig  Dispense  Refill   .  allopurinol (ZYLOPRIM) 300 MG tablet  TAKE ONE TABLET BY MOUTH EVERY DAY  30 tablet  0   .  anastrozole (ARIMIDEX) 1 MG tablet  TAKE ONE TABLET BY MOUTH EVERY DAY  30 tablet  5   .  levothyroxine (SYNTHROID, LEVOTHROID)  50 MCG tablet  TAKE ONE TABLET BY MOUTH EVERY DAY  30 tablet  11   .  lisinopril-hydrochlorothiazide (PRINZIDE,ZESTORETIC) 20-12.5 MG per tablet  TAKE ONE TABLET BY MOUTH EVERY DAY  30 tablet  0   .  metoprolol succinate (TOPROL XL) 25 MG 24 hr tablet  Take 1 tablet (25 mg total) by mouth 2 (two) times daily.     .  Multiple Vitamins-Minerals (EYE-VITE EXTRA PO)  Take by mouth.     Marland Kitchen  omeprazole (PRILOSEC) 20 MG capsule  TAKE ONE CAPSULE BY MOUTH EVERY DAY  30 capsule  0   .  potassium chloride (K-DUR,KLOR-CON) 10 MEQ tablet  Take 1 tablet (10 mEq total) by mouth daily.  90 tablet  3   .  simvastatin (ZOCOR) 20 MG tablet  TAKE ONE TABLET BY MOUTH EVERY DAY  30 tablet  0   .  TAZTIA XT 180 MG 24 hr capsule  TAKE ONE CAPSULE BY MOUTH EVERY DAY  30 each  11   .  traMADol (ULTRAM) 50 MG tablet  Take 50 mg by mouth every 6 (six) hours as needed.     .  warfarin (COUMADIN) 5 MG tablet  TAKE AS DIRECTED BY MOUTH  30 tablet  0   .  zolpidem (AMBIEN) 5 MG tablet  Take 1 tablet (5 mg total) by mouth at bedtime as needed for sleep.  15 tablet  4    No current facility-administered medications for this visit.     Allergies: No Known Allergies   Social History: The patient reports that she has never smoked. She has never used smokeless tobacco. She reports that she does not drink alcohol or use illicit drugs.   ROS: Please see the history of present illness. All other systems reviewed and negative.   PHYSICAL EXAM:  VS: BP 117/67  Pulse 64  Ht 4\' 9"  (1.448 m)  Wt 117 lb (53.071 kg)  BMI 25.31 kg/m2  Well nourished, well developed, in no acute distress  HEENT: normal  Neck: minimally elevated JVD at 90 degrees  Cardiac: normal S1, S2; RRR; no murmur  Lungs: clear to auscultation bilaterally, no wheezing, rhonchi or rales  Abd: soft, nontender  Ext: 1-2+ bilateral LE edema, R>L  Skin: warm and dry  Neuro: CNs 2-12 intact, no focal abnormalities noted   EKG: AFib, HR 70   ASSESSMENT AND PLAN:     1. Atrial Fibrillation: She is probably somewhat symptomatic from her AFib, but this is not entirely clear. I reviewed her echo with Dr. Tenny Craw. Her LA is fairly large and she may not hold NSR. INRs have been therapeutic. INR in our office today is 2.4. We will attempt DCCV to restore NSR. If her symptoms of fatigue and DOE improve with NSR, then we would pursue a strategy of rhythm control with amiodarone (if she has recurrent AFib). If her symptoms do not change, then we would continue a strategy of rate control.  2. Edema: I suspect she has an element of diastolic CHF possibly made worse by her AFib. Will add very low dose Lasix. She will take extra K+ on Lasix days. Check repeat bmet in 1 week. 3. Hypertension: Controlled.  4. Disposition: Followup with Dr. Tenny Craw can be arranged after her DCCV.   Luna Glasgow, PA-C 4:09 PM 08/09/2012

## 2012-08-13 NOTE — Telephone Encounter (Signed)
Pt wants to cxl procedure tomorrow, want to talk to dr Tenny Craw more about it at her visit 09-07-12

## 2012-08-13 NOTE — Addendum Note (Signed)
Addended by: Tarri Fuller on: 08/13/2012 09:01 AM   Modules accepted: Orders

## 2012-08-14 ENCOUNTER — Encounter (HOSPITAL_COMMUNITY): Admission: RE | Payer: Self-pay | Source: Ambulatory Visit

## 2012-08-14 ENCOUNTER — Ambulatory Visit (HOSPITAL_COMMUNITY): Admission: RE | Admit: 2012-08-14 | Payer: Medicare Other | Source: Ambulatory Visit | Admitting: Cardiology

## 2012-08-14 SURGERY — CARDIOVERSION
Anesthesia: Monitor Anesthesia Care

## 2012-08-14 NOTE — Telephone Encounter (Signed)
Patient will keep appt in April.

## 2012-08-14 NOTE — Telephone Encounter (Signed)
After speaking with pt, she is adamant that she is not having the scheduled cardioversion.  States she does not want to be put to sleep and shocked.   Advised pt that she would not be put completely to sleer but rather experience conscious sedation similar to that used for a colonoscopy.  Pt states she wants to wait and talk to Dr. Tenny Craw at her scheduled appt on 09/07/2012.  Will forward to Dr. Tenny Craw for review.

## 2012-08-16 ENCOUNTER — Ambulatory Visit (INDEPENDENT_AMBULATORY_CARE_PROVIDER_SITE_OTHER): Payer: Medicare Other | Admitting: General Practice

## 2012-08-16 DIAGNOSIS — I4891 Unspecified atrial fibrillation: Secondary | ICD-10-CM

## 2012-08-16 DIAGNOSIS — Z7901 Long term (current) use of anticoagulants: Secondary | ICD-10-CM

## 2012-08-16 DIAGNOSIS — Z5181 Encounter for therapeutic drug level monitoring: Secondary | ICD-10-CM

## 2012-08-16 LAB — POCT INR: INR: 2.3

## 2012-08-23 ENCOUNTER — Other Ambulatory Visit: Payer: Self-pay | Admitting: Family Medicine

## 2012-08-24 NOTE — Telephone Encounter (Signed)
Ok to refill 15, 2 refills

## 2012-08-24 NOTE — Telephone Encounter (Signed)
rx called pharmacy  

## 2012-09-07 ENCOUNTER — Encounter: Payer: Self-pay | Admitting: Internal Medicine

## 2012-09-07 ENCOUNTER — Ambulatory Visit (INDEPENDENT_AMBULATORY_CARE_PROVIDER_SITE_OTHER): Payer: Medicare Other | Admitting: Internal Medicine

## 2012-09-07 VITALS — BP 135/68 | HR 78 | Ht 59.0 in | Wt 115.0 lb

## 2012-09-07 DIAGNOSIS — I480 Paroxysmal atrial fibrillation: Secondary | ICD-10-CM

## 2012-09-07 DIAGNOSIS — I4891 Unspecified atrial fibrillation: Secondary | ICD-10-CM

## 2012-09-07 MED ORDER — POTASSIUM CHLORIDE CRYS ER 10 MEQ PO TBCR: EXTENDED_RELEASE_TABLET | ORAL | Status: DC

## 2012-09-07 NOTE — Patient Instructions (Addendum)
Your physician wants you to follow-up in: 6 months with Dr. Ross .You will receive a reminder letter in the mail two months in advance. If you don't receive a letter, please call our office to schedule the follow-up appointment.  

## 2012-09-07 NOTE — Progress Notes (Signed)
HPI Patient is an 79 year old with a history of PAF, hypertension and dyslipidemia. I saw her in the  Feb 2013. Since seen she denies palpiations.  Breathing is OK  No dizziness  No CP No Known Allergies  Current Outpatient Prescriptions  Medication Sig Dispense Refill  . allopurinol (ZYLOPRIM) 300 MG tablet TAKE ONE TABLET BY MOUTH EVERY DAY  30 tablet  0  . anastrozole (ARIMIDEX) 1 MG tablet TAKE ONE TABLET BY MOUTH EVERY DAY  30 tablet  5  . cyclobenzaprine (FLEXERIL) 10 MG tablet Take 1 tablet (10 mg total) by mouth every 8 (eight) hours as needed for muscle spasms.  20 tablet  0  . furosemide (LASIX) 20 MG tablet Take 1 tablet daily X 3 days then take 1 tablet on Mondays, Wednesdays and Fridays there after  60 tablet  1  . levothyroxine (SYNTHROID, LEVOTHROID) 50 MCG tablet TAKE ONE TABLET BY MOUTH EVERY DAY  30 tablet  11  . lisinopril-hydrochlorothiazide (PRINZIDE,ZESTORETIC) 20-12.5 MG per tablet TAKE ONE TABLET BY MOUTH ONCE DAILY  30 tablet  0  . metoprolol succinate (TOPROL XL) 25 MG 24 hr tablet Take 25 mg by mouth daily.       . metoprolol succinate (TOPROL-XL) 50 MG 24 hr tablet Take 50 mg by mouth daily. Take with or immediately following a meal.      . Multiple Vitamins-Minerals (EYE-VITE EXTRA PO) Take by mouth.        Marland Kitchen omeprazole (PRILOSEC) 20 MG capsule TAKE ONE CAPSULE BY MOUTH ONCE DAILY  30 capsule  0  . potassium chloride (K-DUR,KLOR-CON) 10 MEQ tablet Take 2 tablets on days that Furosemide is taken, take 1 tablet on all other days  135 tablet  1  . simvastatin (ZOCOR) 20 MG tablet TAKE ONE TABLET BY MOUTH EVERY DAY  30 tablet  0  . TAZTIA XT 180 MG 24 hr capsule TAKE ONE CAPSULE BY MOUTH EVERY DAY  30 each  11  . traMADol (ULTRAM) 50 MG tablet Take 50 mg by mouth every 6 (six) hours as needed.      . warfarin (COUMADIN) 5 MG tablet TAKE AS DIRECTED BY MOUTH  30 tablet  0  . zolpidem (AMBIEN) 5 MG tablet TAKE ONE-HALF TABLET BY MOUTH AS NEEDED FOR SLEEP AT BEDTIME  15  tablet  2   No current facility-administered medications for this visit.    Past Medical History  Diagnosis Date  . Hypertension   . Heart murmur   . Gout   . Hypothyroidism   . Afib   . Muscular degeneration   . Breast cancer, left breast   . Arthritis   . Peptic ulcer disease   . Anemia   . Hyperlipidemia   . Cancer     Left breast  . Renal insufficiency 10/12/2011  . Hx of echocardiogram     a. Echo 2/14:  EF 60-65%, mild AI, MAC, mild MR, mod to severe LAE, mild RAE, PASP 34    Past Surgical History  Procedure Laterality Date  . Cesarean section    . Appendectomy    . Upper gastrointestinal endoscopy    . Breast surgery      left  . Breast lumpectomy  october 2011    left     Family History  Problem Relation Age of Onset  . Cancer Sister     unknown  . Cancer Daughter     stomach cancer    History  Social History  . Marital Status: Widowed    Spouse Name: N/A    Number of Children: N/A  . Years of Education: N/A   Occupational History  . retired 7-11    Social History Main Topics  . Smoking status: Never Smoker   . Smokeless tobacco: Never Used  . Alcohol Use: No  . Drug Use: No  . Sexually Active: Not on file   Other Topics Concern  . Not on file   Social History Narrative  . No narrative on file    Review of Systems:  All systems reviewed.  They are negative to the above problem except as previously stated.  Vital Signs: BP 135/68  Pulse 78  Ht 4\' 11"  (1.499 m)  Wt 115 lb (52.164 kg)  BMI 23.21 kg/m2  Physical Exam Patient is in NAD HEENT:  Normocephalic, atraumatic. EOMI, PERRLA.  Neck: JVP is normal.  No bruits.  Lungs: clear to auscultation. No rales no wheezes.  Heart: irregular rate and rhythm. Normal S1, S2. No S3.   No significant murmurs. PMI not displaced.  Abdomen:  Supple, nontender. Normal bowel sounds. No masses. No hepatomegaly.  Extremities:   Good distal pulses throughout. No lower extremity edema.   Musculoskeletal :moving all extremities.  Neuro:   alert and oriented x3.  CN II-XII grossly intact.  EKG   Atrial fibrillation.  78 bpm.  Nonspecific ST T wave changes.  Assessment and Plan:  1.  Atrial fibrillation.  Keep on same regimen Period CBCs  2.  Hyperlipidemia   Keep on simvistatin.  Last LDL in 08/2011 was 74.  Due to have labs drawn soon.  F/U in 6 mon

## 2012-09-13 ENCOUNTER — Encounter (INDEPENDENT_AMBULATORY_CARE_PROVIDER_SITE_OTHER): Payer: Self-pay | Admitting: General Surgery

## 2012-09-13 ENCOUNTER — Ambulatory Visit: Payer: Medicare Other

## 2012-09-13 ENCOUNTER — Ambulatory Visit (INDEPENDENT_AMBULATORY_CARE_PROVIDER_SITE_OTHER): Payer: Medicare Other | Admitting: General Surgery

## 2012-09-13 VITALS — BP 140/78 | HR 94 | Temp 98.9°F | Resp 18 | Ht 59.0 in | Wt 115.4 lb

## 2012-09-13 DIAGNOSIS — Z853 Personal history of malignant neoplasm of breast: Secondary | ICD-10-CM

## 2012-09-13 DIAGNOSIS — C50919 Malignant neoplasm of unspecified site of unspecified female breast: Secondary | ICD-10-CM

## 2012-09-13 MED ORDER — ANASTROZOLE 1 MG PO TABS: ORAL_TABLET | ORAL | Status: DC

## 2012-09-13 NOTE — Progress Notes (Signed)
Operation: Left partial mastectomy  Date: March 26, 2010  Stage: T1cNx  Hormone receptor status: Positive  HPI: [name] [name] is here for followup of left breast cancer. She denies lumps in either breast.  No reported adenopathy in the cervical or axillary area.  Still taking Arimidex and tolerating it well.  Last mammogram on 01/2012 showed no evidence of malignancy.  PE: Gen.-elderly female in no acute distress.  Right breast-soft, no palpable masses, suspicious skin changes, or nipple discharge.  Left breast-upper outer quadrant scar well healed; no palpable masses present.  Lymph nodes-no palpable cervical, supraclavicular, or axillary adenopathy.  Assessment: Hormone receptor positive left breast cancer status post partial mastectomy-no clinical evidence of recurrence.  Plan:  Refill Arimidex.  Return visit 6 months.

## 2012-09-13 NOTE — Patient Instructions (Signed)
Call if you notice any changes in your breasts. 

## 2012-09-17 ENCOUNTER — Encounter: Payer: Self-pay | Admitting: General Practice

## 2012-09-17 ENCOUNTER — Ambulatory Visit (INDEPENDENT_AMBULATORY_CARE_PROVIDER_SITE_OTHER): Payer: Medicare Other | Admitting: General Practice

## 2012-09-17 DIAGNOSIS — Z7901 Long term (current) use of anticoagulants: Secondary | ICD-10-CM

## 2012-09-17 DIAGNOSIS — I4891 Unspecified atrial fibrillation: Secondary | ICD-10-CM

## 2012-09-17 DIAGNOSIS — Z5181 Encounter for therapeutic drug level monitoring: Secondary | ICD-10-CM

## 2012-09-24 ENCOUNTER — Other Ambulatory Visit: Payer: Self-pay | Admitting: Family Medicine

## 2012-10-18 ENCOUNTER — Ambulatory Visit (INDEPENDENT_AMBULATORY_CARE_PROVIDER_SITE_OTHER): Payer: Medicare Other | Admitting: Family Medicine

## 2012-10-18 ENCOUNTER — Encounter: Payer: Self-pay | Admitting: *Deleted

## 2012-10-18 ENCOUNTER — Telehealth: Payer: Self-pay | Admitting: Family Medicine

## 2012-10-18 DIAGNOSIS — Z7901 Long term (current) use of anticoagulants: Secondary | ICD-10-CM

## 2012-10-18 DIAGNOSIS — Z5181 Encounter for therapeutic drug level monitoring: Secondary | ICD-10-CM

## 2012-10-18 DIAGNOSIS — I4891 Unspecified atrial fibrillation: Secondary | ICD-10-CM

## 2012-10-18 NOTE — Telephone Encounter (Signed)
This is very appropriate - she is almost 89.  Please write note and you can stamp my name.   [name], MD 10/18/2012, 11:45 AM

## 2012-10-18 NOTE — Telephone Encounter (Signed)
Letter mailed to patient as requested.

## 2012-10-18 NOTE — Telephone Encounter (Signed)
Patient requesting doctor's note stating that she needs her mail delivered to her front door instead of to her mailbox (which is too far for her to walk to).  Please call pt.

## 2012-10-18 NOTE — Telephone Encounter (Signed)
Okay to write

## 2012-10-23 ENCOUNTER — Other Ambulatory Visit: Payer: Self-pay | Admitting: Family Medicine

## 2012-10-23 ENCOUNTER — Ambulatory Visit (INDEPENDENT_AMBULATORY_CARE_PROVIDER_SITE_OTHER): Payer: Medicare Other | Admitting: Family Medicine

## 2012-10-23 DIAGNOSIS — I4891 Unspecified atrial fibrillation: Secondary | ICD-10-CM

## 2012-10-23 DIAGNOSIS — Z5181 Encounter for therapeutic drug level monitoring: Secondary | ICD-10-CM

## 2012-10-23 DIAGNOSIS — Z7901 Long term (current) use of anticoagulants: Secondary | ICD-10-CM

## 2012-10-23 MED ORDER — WARFARIN SODIUM 1 MG PO TABS: 1.0000 mg | ORAL_TABLET | ORAL | Status: DC

## 2012-10-24 ENCOUNTER — Other Ambulatory Visit: Payer: Self-pay | Admitting: Family Medicine

## 2012-11-05 ENCOUNTER — Ambulatory Visit (INDEPENDENT_AMBULATORY_CARE_PROVIDER_SITE_OTHER): Payer: Medicare Other | Admitting: Family Medicine

## 2012-11-05 DIAGNOSIS — Z7901 Long term (current) use of anticoagulants: Secondary | ICD-10-CM

## 2012-11-05 DIAGNOSIS — Z5181 Encounter for therapeutic drug level monitoring: Secondary | ICD-10-CM

## 2012-11-05 DIAGNOSIS — I4891 Unspecified atrial fibrillation: Secondary | ICD-10-CM

## 2012-11-12 ENCOUNTER — Ambulatory Visit (INDEPENDENT_AMBULATORY_CARE_PROVIDER_SITE_OTHER): Payer: Medicare Other | Admitting: Family Medicine

## 2012-11-12 DIAGNOSIS — I4891 Unspecified atrial fibrillation: Secondary | ICD-10-CM

## 2012-11-12 DIAGNOSIS — Z5181 Encounter for therapeutic drug level monitoring: Secondary | ICD-10-CM

## 2012-11-12 DIAGNOSIS — Z7901 Long term (current) use of anticoagulants: Secondary | ICD-10-CM

## 2012-11-13 ENCOUNTER — Other Ambulatory Visit: Payer: Self-pay | Admitting: Family Medicine

## 2012-11-14 ENCOUNTER — Ambulatory Visit (INDEPENDENT_AMBULATORY_CARE_PROVIDER_SITE_OTHER): Payer: Medicare Other | Admitting: Family Medicine

## 2012-11-14 DIAGNOSIS — I4891 Unspecified atrial fibrillation: Secondary | ICD-10-CM

## 2012-11-22 ENCOUNTER — Other Ambulatory Visit: Payer: Self-pay | Admitting: Family Medicine

## 2012-11-22 MED ORDER — DILTIAZEM HCL ER BEADS 180 MG PO CP24: ORAL_CAPSULE | ORAL | Status: DC

## 2012-12-03 ENCOUNTER — Ambulatory Visit: Payer: Medicare Other

## 2012-12-03 ENCOUNTER — Ambulatory Visit (INDEPENDENT_AMBULATORY_CARE_PROVIDER_SITE_OTHER): Payer: Medicare Other | Admitting: Family Medicine

## 2012-12-03 DIAGNOSIS — I4891 Unspecified atrial fibrillation: Secondary | ICD-10-CM

## 2012-12-03 DIAGNOSIS — Z7901 Long term (current) use of anticoagulants: Secondary | ICD-10-CM

## 2012-12-03 DIAGNOSIS — Z5181 Encounter for therapeutic drug level monitoring: Secondary | ICD-10-CM

## 2012-12-03 LAB — POCT INR: INR: 3

## 2012-12-07 ENCOUNTER — Other Ambulatory Visit: Payer: Self-pay | Admitting: Physician Assistant

## 2012-12-31 ENCOUNTER — Ambulatory Visit (INDEPENDENT_AMBULATORY_CARE_PROVIDER_SITE_OTHER): Payer: Medicare Other | Admitting: Family Medicine

## 2012-12-31 DIAGNOSIS — I4891 Unspecified atrial fibrillation: Secondary | ICD-10-CM

## 2012-12-31 DIAGNOSIS — Z7901 Long term (current) use of anticoagulants: Secondary | ICD-10-CM

## 2012-12-31 DIAGNOSIS — Z5181 Encounter for therapeutic drug level monitoring: Secondary | ICD-10-CM

## 2012-12-31 LAB — POCT INR: INR: 3.2

## 2013-01-01 ENCOUNTER — Other Ambulatory Visit: Payer: Self-pay | Admitting: Family Medicine

## 2013-01-01 NOTE — Telephone Encounter (Signed)
Advised patient. She states she can't get here this week.  [facility]  Note sent to RN to schedule INR for next week.

## 2013-01-01 NOTE — Telephone Encounter (Signed)
It looks like she got this 18 months ago.  At 79 yo, it really is better medical care for her to be seen. I can't remember her transport issues, but if no way to get to our office, then ok to refill 1 po bid, #14. 0 ref.   She will need to recheck her INR in 1 week.

## 2013-01-01 NOTE — Telephone Encounter (Signed)
Pt states that she feels a "bladder infection" coming on and wants to request abx that she was on the last time she had this issue 6 months ago.  I cannot find documentation of any abx in chart.  Informed pt that she would need appt but she said she cannot get to the office until next week and asked that I send Dr. Patsy Lager a message.

## 2013-01-02 ENCOUNTER — Other Ambulatory Visit: Payer: Self-pay

## 2013-01-02 NOTE — Telephone Encounter (Signed)
Pt said her prescription was transferred to [facility] [address] and pt has rx at home with instructions take 1 tab by mouth daily for 7 days # 14 were given. Spoke with [facility] pharmacist at [employer] and she said pt received correct med and quantity and tell pt to take one tab by mouth twice a day for 7days. Pt voiced understanding. Carlena Sax RN team lead said no safety portal needed.

## 2013-01-02 NOTE — Telephone Encounter (Signed)
Pt said Bactrim not at [facility]; spoke with pharmacist at [facility] and rx ready for pick up. Pt advised.

## 2013-01-02 NOTE — Telephone Encounter (Signed)
Rx called to pharmacy

## 2013-01-02 NOTE — Telephone Encounter (Signed)
INR check scheduled for next week.  Instructed pt to take 1/2 of normal Coumadin dose everyday until re-check.

## 2013-01-07 ENCOUNTER — Ambulatory Visit (INDEPENDENT_AMBULATORY_CARE_PROVIDER_SITE_OTHER)
Admission: RE | Admit: 2013-01-07 | Discharge: 2013-01-07 | Disposition: A | Discharge status: Home / Self Care | Payer: Medicare Other | Source: Ambulatory Visit | Attending: Family Medicine | Admitting: Family Medicine

## 2013-01-07 ENCOUNTER — Ambulatory Visit (INDEPENDENT_AMBULATORY_CARE_PROVIDER_SITE_OTHER): Payer: Medicare Other | Admitting: Family Medicine

## 2013-01-07 ENCOUNTER — Encounter: Payer: Self-pay | Admitting: Family Medicine

## 2013-01-07 ENCOUNTER — Telehealth: Payer: Self-pay

## 2013-01-07 VITALS — BP 118/68 | HR 108 | Temp 97.8°F | Wt 106.5 lb

## 2013-01-07 DIAGNOSIS — Z5181 Encounter for therapeutic drug level monitoring: Secondary | ICD-10-CM

## 2013-01-07 DIAGNOSIS — R29898 Other symptoms and signs involving the musculoskeletal system: Secondary | ICD-10-CM

## 2013-01-07 DIAGNOSIS — I4891 Unspecified atrial fibrillation: Secondary | ICD-10-CM

## 2013-01-07 DIAGNOSIS — Z7901 Long term (current) use of anticoagulants: Secondary | ICD-10-CM

## 2013-01-07 LAB — POCT INR: INR: 5.7

## 2013-01-07 NOTE — Patient Instructions (Addendum)
See Shirlee Limerick about your referral before you leave today. You should not be left alone in the meantime.  Talk with [name] [name] about your coumadin dose.  Finish the antibiotics in the meantime.  Take care.

## 2013-01-07 NOTE — Assessment & Plan Note (Signed)
No CVA on CT head.  Would have patient come back for repeat INR on Thursday with f/u labs ordered.  Would then have patient f/u with PCP next week for further consideration of leg weakness and possible imaging for myelopathy.  Will defer to PCP.  >25 min spent with face to face with patient, >50% counseling and/or coordinating care.

## 2013-01-07 NOTE — Progress Notes (Signed)
R leg weak at baseline, for about a year patient.  L leg weak for the last week.  2 falls in the last week.   On coumadin.  No LOC with the falls.  Recently started with a cane, about 1 month ago.  No arm weakness.   She had bladder pressure, resolved on septra.  Coumadin dose cut in half while on abx.  No fevers.  Due for INR.  Bruising noted after the falls.    Had taken flexeril prev for tremors but not recently.  No leg pain. Could walk up to a about 100 feet, with support and stopping to rest.   Usually lives alone.  She had to drag herself to a phone last night, called son at 3AM.    Usually sees cards in [facility].  Per patient, s/p cardioversion and off BB in meantime.    Meds, vitals, and allergies reviewed.   ROS: See HPI.  Otherwise, noncontributory.  nad Chronically ill appearing Mmm RRR ctab B legs diffusely weak but no weakness in the BUE Diffuse bruising on the BUE noted but not ttp

## 2013-01-07 NOTE — Telephone Encounter (Signed)
Rose with Ct called report CT of head; Dr Para March said to tell family, no acute stroke; instructions when pt was in office still stands about pt not being by herself; Dr Para March will review report further and family will be contacted. [name] [name] pts daughter notified as instructed; [name] [name] said to call pts son [name] [name] at [phone]'; pt will be going home with [name] [name].

## 2013-01-09 ENCOUNTER — Ambulatory Visit: Payer: Medicare Other

## 2013-01-10 ENCOUNTER — Telehealth: Payer: Self-pay | Admitting: *Deleted

## 2013-01-10 ENCOUNTER — Ambulatory Visit: Payer: Medicare Other

## 2013-01-10 NOTE — Telephone Encounter (Signed)
I have been unable to get in touch with patient, I called her son who said that she has been at [facility] since yesterday morning.  He states she has been falling quite a bit and looks like she has been "beat up", due to bruising.  I asked him to call us back with an update.

## 2013-01-10 NOTE — Telephone Encounter (Signed)
Message copied by Eliezer Bottom on Thu Jan 10, 2013 10:16 AM ------      Message from: [name]      Created: Tue Jan 08, 2013  5:52 PM      Regarding: f/u       Can you help set up a f/u with me next week?            Thanks!            ----- Message -----         From: Joaquim Nam, MD         Sent: 01/07/2013  11:20 PM           To: [name], MD            See OV note and CT head note.  Leg weakness. No CVA seen. Needs f/u with you.  Consider myelopathy.   Thanks.         ------

## 2013-01-10 NOTE — Telephone Encounter (Signed)
agreed

## 2013-01-16 ENCOUNTER — Ambulatory Visit: Payer: Medicare Other | Admitting: Family Medicine

## 2013-01-17 ENCOUNTER — Ambulatory Visit: Payer: Medicare Other | Admitting: Internal Medicine

## 2013-01-17 ENCOUNTER — Telehealth: Payer: Self-pay

## 2013-01-17 ENCOUNTER — Ambulatory Visit (INDEPENDENT_AMBULATORY_CARE_PROVIDER_SITE_OTHER): Payer: Medicare Other | Admitting: Family Medicine

## 2013-01-17 ENCOUNTER — Telehealth: Payer: Self-pay | Admitting: Family Medicine

## 2013-01-17 ENCOUNTER — Encounter: Payer: Self-pay | Admitting: Family Medicine

## 2013-01-17 VITALS — BP 108/60 | HR 76 | Temp 98.1°F | Wt 108.0 lb

## 2013-01-17 DIAGNOSIS — J189 Pneumonia, unspecified organism: Secondary | ICD-10-CM

## 2013-01-17 DIAGNOSIS — N179 Acute kidney failure, unspecified: Secondary | ICD-10-CM

## 2013-01-17 DIAGNOSIS — R197 Diarrhea, unspecified: Secondary | ICD-10-CM

## 2013-01-17 DIAGNOSIS — K529 Noninfective gastroenteritis and colitis, unspecified: Secondary | ICD-10-CM

## 2013-01-17 DIAGNOSIS — E860 Dehydration: Secondary | ICD-10-CM

## 2013-01-17 LAB — CBC WITH DIFFERENTIAL/PLATELET
Basophils Relative: 0 % (ref 0.0–3.0)
Eosinophils Absolute: 0 10*3/uL (ref 0.0–0.7)
HCT: 35.8 % — ABNORMAL LOW (ref 36.0–46.0)
Lymphs Abs: 1.6 10*3/uL (ref 0.7–4.0)
MCHC: 32.6 g/dL (ref 30.0–36.0)
MCV: 91.4 fl (ref 78.0–100.0)
Monocytes Absolute: 0.9 10*3/uL (ref 0.1–1.0)
Neutro Abs: 13 10*3/uL — ABNORMAL HIGH (ref 1.4–7.7)
Neutrophils Relative %: 83.9 % — ABNORMAL HIGH (ref 43.0–77.0)
RBC: 3.92 Mil/uL (ref 3.87–5.11)

## 2013-01-17 LAB — BASIC METABOLIC PANEL
CO2: 15 mEq/L — ABNORMAL LOW (ref 19–32)
Calcium: 9.5 mg/dL (ref 8.4–10.5)
Creatinine, Ser: 1.9 mg/dL — ABNORMAL HIGH (ref 0.4–1.2)
Glucose, Bld: 117 mg/dL — ABNORMAL HIGH (ref 70–99)

## 2013-01-17 NOTE — Telephone Encounter (Signed)
Erma with [facility] said pt fell last night due to weakness, EMS came out but did not transport pt. Pt discharged from [facility] last week due to dehydration(01/09/13 -01/11/13); Pt has had more than 15 loose stools in last 24 hours.pt is able to drink liquids; no fever, abd pain or n or v. Dr Patsy Lager will see today at 11:15 am. Erma will let pt and family know.

## 2013-01-17 NOTE — Patient Instructions (Addendum)
Prevent dehydration: drink Gatorade, Pedialyte, Ginger Ale, popsicles  Immodium A-D over ther counter: 2 tablets now then 1 tablet after every bowel movement.  day 1: clear liquids day 1: clear liquids--2-3 oz every 45-60 min. SMALL SIPS OR ICE CHIPS 7-up, ginger ale, sprite tea--no cofee chicken broth plain jello Water  Day 2: contnue clear liquids and add BRAT diet B--banana R--rice---can use chicken noodle or rice soup A--apple sauce T--dry toast GRITS, CREAM OF WHEAT, OATMEAL OK  Day 3: continue day 2 and add simple, non fat non spicy foods1 at a time to diet as canned peaches or pears backed or broiled chicken breast---or lunch meat boiled white potato-cook in chicken broth Chicken and rice or chicken noodles

## 2013-01-17 NOTE — Telephone Encounter (Signed)
Patient Information:  Caller Name: Leonette Most  Phone: [phone]  Patient: [name] [name], [name] [name]  Gender: Female  DOB: 08/01/1923  Age: 79 Years  PCP: [name] (Family Practice)  Office Follow Up:  Does the office need to follow up with this patient?: No  Instructions For The Office: N/A  RN Note:  Pt's son gave the pt's phone number and I contacted the pt to conplete triage.  Per disposition contacted the office and spoke with Rena. Per Dr Dallas Schimke pt will be worked in at 1115 today 01/17/13.  Symptoms  Reason For Call & Symptoms: Pt's son states the pt reports she is continuing to have problems with dehydration.  Son reports pt fell and EMS came to the home  but did not transport her.  Pt's son reports pt has appt this afternoon but he is concerned she is too dehydrated to wait that long. Also reports pt admitted to [facility] for dehydration  on Wed 01/09/13 and discharged 01/11/13.  Reviewed Health History In EMR: Yes  Reviewed Medications In EMR: Yes  Reviewed Allergies In EMR: Yes  Reviewed Surgeries / Procedures: Yes  Date of Onset of Symptoms: 01/11/2013  Guideline(s) Used:  Diarrhea  Disposition Per Guideline:   Go to Office Now  Reason For Disposition Reached:   Age < 60 years and has had > 15 diarrhea stools in past 24 hours  Advice Given:  If pt becomes worse before appt time go to ED for evaluation.  Patient Will Follow Care Advice:  YES  Appointment Scheduled:  01/17/2013 11:15:00 Appointment Scheduled Provider:  [name] [facility])

## 2013-01-17 NOTE — Progress Notes (Signed)
[facility] at [facility] [address] [place] [address] Phone: [phone] Fax: [phone]  Date:  01/17/2013   Name:  [name] [name]   DOB:  [birth year]   MRN:  [id number] Gender: female Age: 79 y.o.  Primary Physician:  [name], MD  Evaluating MD: [name], MD   Chief Complaint: Hospitalization Follow-up, Diarrhea and Dehydration   History of Present Illness:  [name] [name] is a 79 y.o. pleasant patient who presents with the following:  79 yo AF on coumadin and AF, amiodarone, renal insuff:  Recent hosp weakness to [facility]. 8/13 - 8/15 Primary dx CAP (now on LVQ), dehydration, ARF (CR max 2.2 and 1.2 at time of discharge).  Additionally, they have held her diuretics and d/c her coumadin given multiple recent falls.  Now every 2 hours or so is having loose, watery stool. 5 days, started. Every 2 hours.  Felt fine on wed, Thursday, but just weak.   Drank 9 glasses of water a day right now. Urinating and peeing ok. Essentially about like normal  Dr. Florestine Avers (cardiologist) in [place]  Patient Active Problem List   Diagnosis Date Noted  . Leg weakness 01/07/2013  . Renal insufficiency 10/12/2011  . Tremor 01/24/2011  . Osteopenia 12/25/2010  . Macular degeneration 11/11/2010  . VERTIGO, POSITIONAL 08/16/2010  . INFILTRATING DUCTAL CARCINOMA, LEFT BREAST 12/30/2009  . INSOMNIA, CHRONIC 12/02/2008  . POSTMENOPAUSAL OSTEOPOROSIS 09/03/2007  . HYPOTHYROIDISM 06/05/2007  . HYPERLIPIDEMIA 06/05/2007  . GOUT 06/05/2007  . ANEMIA-IRON DEFICIENCY 06/05/2007  . HYPERTENSION 06/05/2007  . PAROXYSMAL ATRIAL FIBRILLATION 06/05/2007  . GERD 06/05/2007  . PEPTIC ULCER DISEASE 06/05/2007  . OSTEOARTHRITIS 06/05/2007    Past Medical History  Diagnosis Date  . Hypertension   . Heart murmur   . Gout   . Hypothyroidism   . Afib   . Muscular degeneration   . Breast cancer, left breast   . Arthritis   . Peptic ulcer disease    . Anemia   . Hyperlipidemia   . Cancer     Left breast  . Renal insufficiency 10/12/2011  . Hx of echocardiogram     a. Echo 2/14:  EF 60-65%, mild AI, MAC, mild MR, mod to severe LAE, mild RAE, PASP 34    Past Surgical History  Procedure Laterality Date  . Cesarean section    . Appendectomy    . Upper gastrointestinal endoscopy    . Breast surgery      left  . Breast lumpectomy  october 2011    left     History   Social History  . Marital Status: Widowed    Spouse Name: N/A    Number of Children: N/A  . Years of Education: N/A   Occupational History  . retired 7-[employer]    Social History Main Topics  . Smoking status: Never Smoker   . Smokeless tobacco: Never Used  . Alcohol Use: No  . Drug Use: No  . Sexual Activity: Not on file   Other Topics Concern  . Not on file   Social History Narrative  . No narrative on file    Family History  Problem Relation Age of Onset  . Cancer Sister     unknown  . Cancer Daughter     stomach cancer    No Known Allergies  Medication list has been reviewed and updated.  Outpatient Prescriptions Prior to Visit  Medication Sig Dispense Refill  .  allopurinol (ZYLOPRIM) 300 MG tablet TAKE ONE TABLET BY MOUTH EVERY DAY  30 tablet  0  . amiodarone (PACERONE) 200 MG tablet Take 200 mg by mouth daily.      Marland Kitchen anastrozole (ARIMIDEX) 1 MG tablet TAKE ONE TABLET BY MOUTH EVERY DAY  30 tablet  5  . diltiazem (TAZTIA XT) 180 MG 24 hr capsule TAKE ONE CAPSULE BY MOUTH EVERY DAY  30 capsule  5  . levothyroxine (SYNTHROID, LEVOTHROID) 50 MCG tablet TAKE ONE TABLET BY MOUTH ONCE DAILY  30 tablet  6  . lisinopril-hydrochlorothiazide (PRINZIDE,ZESTORETIC) 20-12.5 MG per tablet TAKE ONE TABLET BY MOUTH ONCE DAILY  30 tablet  6  . metoprolol succinate (TOPROL-XL) 50 MG 24 hr tablet Take 50 mg by mouth daily. Take with or immediately following a meal.      . Multiple Vitamins-Minerals (EYE-VITE EXTRA PO) Take by mouth 2 (two) times daily.         Marland Kitchen omeprazole (PRILOSEC) 20 MG capsule TAKE ONE CAPSULE BY MOUTH ONCE DAILY  30 capsule  6  . simvastatin (ZOCOR) 20 MG tablet TAKE ONE TABLET BY MOUTH EVERY DAY  30 tablet  0  . traMADol (ULTRAM) 50 MG tablet Take 50 mg by mouth every 6 (six) hours as needed.      . zolpidem (AMBIEN) 5 MG tablet TAKE ONE-HALF TABLET BY MOUTH AS NEEDED FOR SLEEP AT BEDTIME  15 tablet  2  . cyclobenzaprine (FLEXERIL) 10 MG tablet Take 1 tablet (10 mg total) by mouth every 8 (eight) hours as needed for muscle spasms.  20 tablet  0  . furosemide (LASIX) 20 MG tablet Take 1 tablet daily X 3 days then take 1 tablet on Mondays, Wednesdays and Fridays there after  60 tablet  1  . potassium chloride (K-DUR,KLOR-CON) 10 MEQ tablet Take 2 tablets on days that Furosemide is taken, take 1 tablet on all other days  135 tablet  3  . simvastatin (ZOCOR) 20 MG tablet TAKE ONE TABLET BY MOUTH ONCE DAILY  30 tablet  6  . sulfamethoxazole-trimethoprim (BACTRIM,SEPTRA) 400-80 MG per tablet Take one by mouth twice a day x 7 days  14 tablet  0  . warfarin (COUMADIN) 1 MG tablet Take 1 tablet (1 mg total) by mouth as directed.  30 tablet  1  . warfarin (COUMADIN) 5 MG tablet Take as directed by [facility].  30 tablet  2   No facility-administered medications prior to visit.    Review of Systems:  As above. Recent falls. Bruising. Diarrhea. No nausea. Wound on R leg. Some le swelling. No chest pain.  Physical Examination: BP 108/60  Pulse 76  Temp(Src) 98.1 F (36.7 C) (Tympanic)  Wt 108 lb (48.988 kg)  BMI 21.8 kg/m2  SpO2 96%  Ideal Body Weight:     GEN: WDWN, NAD, frail appearing. HEENT: Atraumatic, Normocephalic. Neck supple. No masses, No LAD. Ears and Nose: No external deformity. CV: RRR, No M/G/R. No JVD. No thrill. No extra heart sounds. PULM: CTA B, no wheezes, crackles, rhonchi. No retractions. No resp. distress. No accessory muscle use. EXTR: No c/c/e Abd: s, nt, nd, +bs. NEURO Normal gait.  PSYCH:  Normally interactive. Conversant. Not depressed or anxious appearing.  Calm demeanor.    Assessment and Plan:  Severe diarrhea - Plan: Basic metabolic panel, CBC with Differential, Stool culture, Clostridium difficile EIA, CANCELED: Clostridium Difficile by PCR, CANCELED: Clostridium Difficile by PCR  Dehydration  CAP (community acquired pneumonia)  Acute renal  failure  Complex case in acute post-hosp period with severe diarrhea in an 79 year old. Appears to be making urine adequately currently. May be secondary to abx, which I am having her stop. Check c. Diff.  Intake suggested more gatorade and broth. Recheck labs and renal function.  Appears stable for outpatient management as of 01/17/2013 in the AM.  Orders Today:  Orders Placed This Encounter  Procedures  . Stool culture  . Clostridium difficile EIA  . Basic metabolic panel  . CBC with Differential    Updated Medication List: (Includes new medications, updates to list, dose adjustments) No orders of the defined types were placed in this encounter.    Medications Discontinued: Medications Discontinued During This Encounter  Medication Reason  . cyclobenzaprine (FLEXERIL) 10 MG tablet Therapy completed  . furosemide (LASIX) 20 MG tablet Discontinued by provider  . potassium chloride (K-DUR,KLOR-CON) 10 MEQ tablet Discontinued by provider  . simvastatin (ZOCOR) 20 MG tablet Duplicate  . sulfamethoxazole-trimethoprim (BACTRIM,SEPTRA) 400-80 MG per tablet Therapy completed  . warfarin (COUMADIN) 1 MG tablet Discontinued by provider  . warfarin (COUMADIN) 5 MG tablet Discontinued by provider      Signed, Elpidio Galea. Johnye Kist, MD 01/17/2013 12:04 PM

## 2013-01-21 ENCOUNTER — Other Ambulatory Visit: Payer: Self-pay | Admitting: Family Medicine

## 2013-01-21 NOTE — Telephone Encounter (Signed)
Refill called to [facility]. 

## 2013-01-21 NOTE — Telephone Encounter (Signed)
Ok to refill 15, 0 refills

## 2013-01-22 ENCOUNTER — Emergency Department (HOSPITAL_COMMUNITY): Payer: Medicare Other

## 2013-01-22 ENCOUNTER — Encounter (HOSPITAL_COMMUNITY): Payer: Self-pay | Admitting: Emergency Medicine

## 2013-01-22 ENCOUNTER — Encounter: Payer: Self-pay | Admitting: Radiology

## 2013-01-22 ENCOUNTER — Inpatient Hospital Stay (HOSPITAL_COMMUNITY)
Admission: EM | Admit: 2013-01-22 | Discharge: 2013-01-28 | DRG: 380 | Disposition: E | Payer: Medicare Other | Attending: Internal Medicine | Admitting: Internal Medicine

## 2013-01-22 DIAGNOSIS — K2970 Gastritis, unspecified, without bleeding: Secondary | ICD-10-CM

## 2013-01-22 DIAGNOSIS — E785 Hyperlipidemia, unspecified: Secondary | ICD-10-CM | POA: Diagnosis present

## 2013-01-22 DIAGNOSIS — K5730 Diverticulosis of large intestine without perforation or abscess without bleeding: Secondary | ICD-10-CM | POA: Diagnosis present

## 2013-01-22 DIAGNOSIS — J189 Pneumonia, unspecified organism: Secondary | ICD-10-CM | POA: Diagnosis present

## 2013-01-22 DIAGNOSIS — R0609 Other forms of dyspnea: Secondary | ICD-10-CM | POA: Diagnosis present

## 2013-01-22 DIAGNOSIS — M169 Osteoarthritis of hip, unspecified: Secondary | ICD-10-CM | POA: Diagnosis present

## 2013-01-22 DIAGNOSIS — I5031 Acute diastolic (congestive) heart failure: Secondary | ICD-10-CM | POA: Diagnosis present

## 2013-01-22 DIAGNOSIS — I509 Heart failure, unspecified: Secondary | ICD-10-CM | POA: Diagnosis present

## 2013-01-22 DIAGNOSIS — D72829 Elevated white blood cell count, unspecified: Secondary | ICD-10-CM | POA: Diagnosis present

## 2013-01-22 DIAGNOSIS — C50919 Malignant neoplasm of unspecified site of unspecified female breast: Secondary | ICD-10-CM

## 2013-01-22 DIAGNOSIS — K3189 Other diseases of stomach and duodenum: Secondary | ICD-10-CM | POA: Diagnosis present

## 2013-01-22 DIAGNOSIS — R1013 Epigastric pain: Secondary | ICD-10-CM | POA: Diagnosis present

## 2013-01-22 DIAGNOSIS — K449 Diaphragmatic hernia without obstruction or gangrene: Secondary | ICD-10-CM | POA: Diagnosis present

## 2013-01-22 DIAGNOSIS — E876 Hypokalemia: Secondary | ICD-10-CM | POA: Diagnosis present

## 2013-01-22 DIAGNOSIS — R627 Adult failure to thrive: Secondary | ICD-10-CM | POA: Diagnosis present

## 2013-01-22 DIAGNOSIS — M129 Arthropathy, unspecified: Secondary | ICD-10-CM | POA: Diagnosis present

## 2013-01-22 DIAGNOSIS — K311 Adult hypertrophic pyloric stenosis: Secondary | ICD-10-CM

## 2013-01-22 DIAGNOSIS — K2960 Other gastritis without bleeding: Secondary | ICD-10-CM | POA: Diagnosis present

## 2013-01-22 DIAGNOSIS — R29898 Other symptoms and signs involving the musculoskeletal system: Secondary | ICD-10-CM

## 2013-01-22 DIAGNOSIS — J9819 Other pulmonary collapse: Secondary | ICD-10-CM | POA: Diagnosis present

## 2013-01-22 DIAGNOSIS — I10 Essential (primary) hypertension: Secondary | ICD-10-CM | POA: Diagnosis present

## 2013-01-22 DIAGNOSIS — M1610 Unilateral primary osteoarthritis, unspecified hip: Secondary | ICD-10-CM | POA: Diagnosis present

## 2013-01-22 DIAGNOSIS — R109 Unspecified abdominal pain: Secondary | ICD-10-CM | POA: Diagnosis present

## 2013-01-22 DIAGNOSIS — M109 Gout, unspecified: Secondary | ICD-10-CM | POA: Diagnosis present

## 2013-01-22 DIAGNOSIS — I4891 Unspecified atrial fibrillation: Secondary | ICD-10-CM | POA: Diagnosis present

## 2013-01-22 DIAGNOSIS — I469 Cardiac arrest, cause unspecified: Secondary | ICD-10-CM | POA: Diagnosis not present

## 2013-01-22 DIAGNOSIS — K279 Peptic ulcer, site unspecified, unspecified as acute or chronic, without hemorrhage or perforation: Secondary | ICD-10-CM | POA: Diagnosis present

## 2013-01-22 DIAGNOSIS — R0989 Other specified symptoms and signs involving the circulatory and respiratory systems: Secondary | ICD-10-CM | POA: Diagnosis present

## 2013-01-22 DIAGNOSIS — R112 Nausea with vomiting, unspecified: Secondary | ICD-10-CM | POA: Diagnosis present

## 2013-01-22 DIAGNOSIS — R011 Cardiac murmur, unspecified: Secondary | ICD-10-CM | POA: Diagnosis present

## 2013-01-22 DIAGNOSIS — N183 Chronic kidney disease, stage 3 unspecified: Secondary | ICD-10-CM | POA: Diagnosis present

## 2013-01-22 DIAGNOSIS — N289 Disorder of kidney and ureter, unspecified: Secondary | ICD-10-CM

## 2013-01-22 DIAGNOSIS — R1110 Vomiting, unspecified: Secondary | ICD-10-CM

## 2013-01-22 DIAGNOSIS — E875 Hyperkalemia: Secondary | ICD-10-CM | POA: Diagnosis present

## 2013-01-22 DIAGNOSIS — K219 Gastro-esophageal reflux disease without esophagitis: Secondary | ICD-10-CM | POA: Diagnosis present

## 2013-01-22 DIAGNOSIS — I129 Hypertensive chronic kidney disease with stage 1 through stage 4 chronic kidney disease, or unspecified chronic kidney disease: Secondary | ICD-10-CM | POA: Diagnosis present

## 2013-01-22 DIAGNOSIS — Z853 Personal history of malignant neoplasm of breast: Secondary | ICD-10-CM

## 2013-01-22 DIAGNOSIS — D649 Anemia, unspecified: Secondary | ICD-10-CM | POA: Diagnosis present

## 2013-01-22 DIAGNOSIS — Z79899 Other long term (current) drug therapy: Secondary | ICD-10-CM

## 2013-01-22 DIAGNOSIS — E039 Hypothyroidism, unspecified: Secondary | ICD-10-CM | POA: Diagnosis present

## 2013-01-22 LAB — CBC WITH DIFFERENTIAL/PLATELET
Basophils Absolute: 0 10*3/uL (ref 0.0–0.1)
Basophils Relative: 0 % (ref 0–1)
Eosinophils Absolute: 0 10*3/uL (ref 0.0–0.7)
Eosinophils Relative: 0 % (ref 0–5)
HCT: 33.5 % — ABNORMAL LOW (ref 36.0–46.0)
MCH: 29.7 pg (ref 26.0–34.0)
MCHC: 33.7 g/dL (ref 30.0–36.0)
MCV: 88.2 fL (ref 78.0–100.0)
Monocytes Absolute: 0.7 10*3/uL (ref 0.1–1.0)
Platelets: 329 10*3/uL (ref 150–400)
RDW: 20.7 % — ABNORMAL HIGH (ref 11.5–15.5)
WBC: 11.7 10*3/uL — ABNORMAL HIGH (ref 4.0–10.5)

## 2013-01-22 LAB — URINALYSIS, ROUTINE W REFLEX MICROSCOPIC
Hgb urine dipstick: NEGATIVE
Nitrite: NEGATIVE
Specific Gravity, Urine: 1.023 (ref 1.005–1.030)
Urobilinogen, UA: 0.2 mg/dL (ref 0.0–1.0)
pH: 5.5 (ref 5.0–8.0)

## 2013-01-22 LAB — COMPREHENSIVE METABOLIC PANEL
ALT: 15 U/L (ref 0–35)
AST: 21 U/L (ref 0–37)
CO2: 22 mEq/L (ref 19–32)
Calcium: 8.9 mg/dL (ref 8.4–10.5)
Creatinine, Ser: 1.82 mg/dL — ABNORMAL HIGH (ref 0.50–1.10)
GFR calc non Af Amer: 24 mL/min — ABNORMAL LOW (ref >90)
Sodium: 132 mEq/L — ABNORMAL LOW (ref 135–145)
Total Protein: 6.1 g/dL (ref 6.0–8.3)

## 2013-01-22 LAB — LIPASE, BLOOD: Lipase: 23 U/L (ref 11–59)

## 2013-01-22 LAB — STOOL CULTURE

## 2013-01-22 LAB — PROTIME-INR: Prothrombin Time: 13.4 seconds (ref 11.6–15.2)

## 2013-01-22 LAB — POCT I-STAT TROPONIN I: Troponin i, poc: 0.02 ng/mL (ref 0.00–0.08)

## 2013-01-22 LAB — LACTIC ACID, PLASMA: Lactic Acid, Venous: 1.8 mmol/L (ref 0.5–2.2)

## 2013-01-22 MED ORDER — ONDANSETRON HCL 4 MG/2ML IJ SOLN
4.0000 mg | Freq: Once | INTRAMUSCULAR | Status: AC
  Administered 2013-01-22: 4 mg via INTRAVENOUS
  Filled 2013-01-22: qty 2

## 2013-01-22 MED ORDER — FENTANYL CITRATE 0.05 MG/ML IJ SOLN
50.0000 ug | Freq: Once | INTRAMUSCULAR | Status: AC
  Administered 2013-01-22: 50 ug via INTRAVENOUS
  Filled 2013-01-22: qty 2

## 2013-01-22 MED ORDER — POTASSIUM CHLORIDE 20 MEQ/15ML (10%) PO LIQD
40.0000 meq | Freq: Once | ORAL | Status: AC
  Administered 2013-01-22: 40 meq via ORAL
  Filled 2013-01-22: qty 30

## 2013-01-22 MED ORDER — SODIUM CHLORIDE 0.9 % IV BOLUS (SEPSIS)
1000.0000 mL | Freq: Once | INTRAVENOUS | Status: AC
  Administered 2013-01-22: 1000 mL via INTRAVENOUS

## 2013-01-22 MED ORDER — IOHEXOL 300 MG/ML  SOLN: 50.0000 mL | Freq: Once | INTRAMUSCULAR | Status: AC | PRN

## 2013-01-22 NOTE — ED Notes (Signed)
Pt's daughter, Elnita Maxwell [phone].

## 2013-01-22 NOTE — ED Provider Notes (Signed)
CSN: [id number]     Arrival date & time 01/23/2013  1708 History   First MD Initiated Contact with Patient 01/24/2013 1822     Chief Complaint  Patient presents with  . Nausea  . Diarrhea  . Emesis  . Fatigue   (Consider location/radiation/quality/duration/timing/severity/associated sxs/prior Treatment) HPI This is an 79 year old female who presents with vomiting and stomachache. The patient has a history of hypertension, atrial fibrillation, and renal insufficiency. Patient reports onset of nonbilious, nonbloody emesis last night at 7 PM. She denies any diarrhea. She denies any fevers. She reports epigastric pain that radiates to her back. Current pain is 6/10. She denies any dysuria or frequency. She denies any sick contacts.  She denies onset of symptoms following food intake. Past Medical History  Diagnosis Date  . Hypertension   . Heart murmur   . Gout   . Hypothyroidism   . Afib   . Muscular degeneration   . Breast cancer, left breast   . Arthritis   . Peptic ulcer disease   . Anemia   . Hyperlipidemia   . Cancer     Left breast  . Renal insufficiency 10/12/2011  . Hx of echocardiogram     a. Echo 2/14:  EF 60-65%, mild AI, MAC, mild MR, mod to severe LAE, mild RAE, PASP 34   Past Surgical History  Procedure Laterality Date  . Cesarean section    . Appendectomy    . Upper gastrointestinal endoscopy    . Breast surgery      left  . Breast lumpectomy  october 2011    left    Family History  Problem Relation Age of Onset  . Cancer Sister     unknown  . Cancer Daughter     stomach cancer   History  Substance Use Topics  . Smoking status: Never Smoker   . Smokeless tobacco: Never Used  . Alcohol Use: No   OB History   Grav Para Term Preterm Abortions TAB SAB Ect Mult Living                 Review of Systems  Constitutional: Negative for fever.  Respiratory: Negative for cough, chest tightness and shortness of breath.   Cardiovascular: Negative for chest  pain.  Gastrointestinal: Positive for nausea, vomiting and abdominal pain. Negative for diarrhea and blood in stool.  Genitourinary: Negative for dysuria.  Musculoskeletal: Negative for back pain.  Skin: Negative for wound.  Neurological: Negative for dizziness and syncope.  Psychiatric/Behavioral: Negative for confusion.  All other systems reviewed and are negative.    Allergies  Review of patient's allergies indicates no known allergies.  Home Medications   No current outpatient prescriptions on file. BP 106/60  Pulse 96  Temp(Src) 97.4 F (36.3 C) (Oral)  Resp 18  SpO2 97% Physical Exam  Nursing note and vitals reviewed. Constitutional: She is oriented to person, place, and time.  Elderly, no acute distress  HENT:  Head: Normocephalic and atraumatic.  Mucous membranes dry  Eyes: Pupils are equal, round, and reactive to light.  Neck: Neck supple.  Cardiovascular: Normal rate, regular rhythm and normal heart sounds.   Pulmonary/Chest: Effort normal and breath sounds normal. No respiratory distress. She has no wheezes.  Abdominal: Soft. Bowel sounds are normal. There is tenderness.  Neurological: She is alert and oriented to person, place, and time.  Skin: Skin is warm and dry.  Psychiatric: She has a normal mood and affect.    ED Course  Procedures (including critical care time) Labs Review Labs Reviewed  CBC WITH DIFFERENTIAL - Abnormal; Notable for the following:    WBC 11.7 (*)    RBC 3.80 (*)    Hemoglobin 11.3 (*)    HCT 33.5 (*)    RDW 20.7 (*)    Neutrophils Relative % 91 (*)    Neutro Abs 10.6 (*)    Lymphocytes Relative 3 (*)    Lymphs Abs 0.4 (*)    All other components within normal limits  COMPREHENSIVE METABOLIC PANEL - Abnormal; Notable for the following:    Sodium 132 (*)    Potassium 3.1 (*)    Chloride 94 (*)    Glucose, Bld 134 (*)    Creatinine, Ser 1.82 (*)    Albumin 2.7 (*)    Alkaline Phosphatase 128 (*)    GFR calc non Af Amer 24  (*)    GFR calc Af Amer 28 (*)    All other components within normal limits  URINALYSIS, ROUTINE W REFLEX MICROSCOPIC  LACTIC ACID, PLASMA  LIPASE, BLOOD  PROTIME-INR  CBC  BASIC METABOLIC PANEL  POCT I-STAT TROPONIN I   Imaging Review Ct Abdomen Pelvis Wo Contrast  01/10/2013   *RADIOLOGY REPORT*  Clinical Data: Nausea, vomiting, pneumonia, weakness.  CT ABDOMEN AND PELVIS WITHOUT CONTRAST  Technique:  Multidetector CT imaging of the abdomen and pelvis was performed following the standard protocol without intravenous contrast.  Comparison: None.  Findings: Small bilateral pleural effusions with some adjacent consolidation / atelectasis in the visualized left lung base.  A large hiatal hernia.  The stomach is distended. Duodenum and distal bowel and colon decompressed.  A few scattered sigmoid and descending colon diverticula.  Unremarkable uninfused evaluation of liver, spleen, adrenal glands.  Marked bilateral renal parenchymal atrophy without focal lesion.  Mild pancreatic parenchymal atrophy without focal lesion. Patchy aortoiliac arterial calcifications without aneurysm.  Uterus and adnexal regions unremarkable.  Urinary bladder physiologically distended.  No ascites.  No free air.  Left pelvic phlebolith. Spondylitic changes throughout the lumbar spine.  IMPRESSION:  1.  Large hiatal hernia with marked gastric distention and a decompressed duodenum suggesting possible gastric outlet obstruction. 2.  Sigmoid and descending colon diverticulosis. 3.  Small bilateral pleural effusions.   Original Report Authenticated By: D. Andria Rhein, MD   EKG independently reviewed by myself: Atrial fibrillation with a rate of 13, no evidence of ST elevation ischemia  MDM   1. Vomiting   2. Gastric outlet obstruction   3. Hypokalemia   4. Malignant neoplasm of breast (female), unspecified site   5. Renal insufficiency    This is an 79 year old female who presents with vomiting and epigastric pain. She  appears uncomfortable on exam. Vital signs are notable for tachycardia.  Patient has a known history vaginal fibrillation. Basic labwork was obtained. Patient was given a normal saline bolus. Patient continued to have pain in the ER. Lab work shows hypokalemia. CT scan noncontrasted was performed as the patient was unable to tolerate by mouth contrast. There is gastric distention and possible gastric outlet obstruction. An NG tube was placed. I discussed the patient with the surgeon on call in suggested gastric decompression. I will follow the hospital. Patient be admitted.   Shon Baton, MD 01/23/13 [phone]

## 2013-01-22 NOTE — ED Notes (Signed)
Pt states she is unable to give urine sample at this time. Pt resting quietly watching TV. Medicated for pain.

## 2013-01-22 NOTE — ED Notes (Signed)
Pt refusing Gastric tube at this time

## 2013-01-22 NOTE — ED Notes (Signed)
MD at bedside speaking to pt and family at this time

## 2013-01-22 NOTE — ED Notes (Signed)
Per EMS pt was discharged from [facility] on Friday, pt was admitted there for n/v/d and PNA.  Pt started having n/v/d that started yesterday and generalized weakness. Pt is unable to get up and ambulate. Pt has 20g in left AC and was given Zofran 4mg  in route.

## 2013-01-23 ENCOUNTER — Telehealth: Payer: Self-pay | Admitting: Family Medicine

## 2013-01-23 ENCOUNTER — Encounter: Payer: Self-pay | Admitting: Internal Medicine

## 2013-01-23 ENCOUNTER — Ambulatory Visit: Payer: Medicare Other | Admitting: Family Medicine

## 2013-01-23 DIAGNOSIS — K311 Adult hypertrophic pyloric stenosis: Secondary | ICD-10-CM | POA: Diagnosis present

## 2013-01-23 DIAGNOSIS — K449 Diaphragmatic hernia without obstruction or gangrene: Secondary | ICD-10-CM

## 2013-01-23 DIAGNOSIS — E876 Hypokalemia: Secondary | ICD-10-CM | POA: Diagnosis present

## 2013-01-23 DIAGNOSIS — N289 Disorder of kidney and ureter, unspecified: Secondary | ICD-10-CM

## 2013-01-23 DIAGNOSIS — C50919 Malignant neoplasm of unspecified site of unspecified female breast: Secondary | ICD-10-CM

## 2013-01-23 DIAGNOSIS — R1110 Vomiting, unspecified: Secondary | ICD-10-CM

## 2013-01-23 LAB — CBC
HCT: 33.6 % — ABNORMAL LOW (ref 36.0–46.0)
Hemoglobin: 10.9 g/dL — ABNORMAL LOW (ref 12.0–15.0)
MCH: 29.1 pg (ref 26.0–34.0)
MCV: 89.8 fL (ref 78.0–100.0)
Platelets: 307 10*3/uL (ref 150–400)
RBC: 3.74 MIL/uL — ABNORMAL LOW (ref 3.87–5.11)
WBC: 12.8 10*3/uL — ABNORMAL HIGH (ref 4.0–10.5)

## 2013-01-23 LAB — BASIC METABOLIC PANEL
BUN: 21 mg/dL (ref 6–23)
CO2: 22 mEq/L (ref 19–32)
Calcium: 8.7 mg/dL (ref 8.4–10.5)
Chloride: 97 mEq/L (ref 96–112)
Creatinine, Ser: 1.71 mg/dL — ABNORMAL HIGH (ref 0.50–1.10)
Glucose, Bld: 126 mg/dL — ABNORMAL HIGH (ref 70–99)

## 2013-01-23 MED ORDER — SODIUM CHLORIDE 0.9 % IV SOLN
INTRAVENOUS | Status: DC
  Administered 2013-01-23 – 2013-01-25 (×3): via INTRAVENOUS
  Filled 2013-01-23 (×6): qty 1000

## 2013-01-23 MED ORDER — ONDANSETRON HCL 4 MG/2ML IJ SOLN
4.0000 mg | Freq: Four times a day (QID) | INTRAMUSCULAR | Status: DC | PRN
  Administered 2013-01-23: 4 mg via INTRAVENOUS
  Filled 2013-01-23: qty 2

## 2013-01-23 MED ORDER — ANASTROZOLE 1 MG PO TABS
1.0000 mg | ORAL_TABLET | Freq: Every morning | ORAL | Status: DC
  Filled 2013-01-23: qty 1

## 2013-01-23 MED ORDER — DILTIAZEM HCL ER COATED BEADS 180 MG PO CP24
180.0000 mg | ORAL_CAPSULE | Freq: Every morning | ORAL | Status: DC
  Filled 2013-01-23: qty 1

## 2013-01-23 MED ORDER — AMIODARONE HCL 200 MG PO TABS
200.0000 mg | ORAL_TABLET | Freq: Every day | ORAL | Status: DC
  Filled 2013-01-23: qty 1

## 2013-01-23 MED ORDER — PANTOPRAZOLE SODIUM 40 MG IV SOLR
40.0000 mg | Freq: Every day | INTRAVENOUS | Status: DC
  Administered 2013-01-23 – 2013-01-26 (×5): 40 mg via INTRAVENOUS
  Filled 2013-01-23 (×7): qty 40

## 2013-01-23 MED ORDER — SODIUM CHLORIDE 0.9 % IJ SOLN
3.0000 mL | Freq: Two times a day (BID) | INTRAMUSCULAR | Status: DC
  Administered 2013-01-23 – 2013-01-26 (×4): 3 mL via INTRAVENOUS

## 2013-01-23 MED ORDER — SODIUM CHLORIDE 0.9 % IV SOLN
INTRAVENOUS | Status: DC
  Administered 2013-01-23: 01:00:00 via INTRAVENOUS

## 2013-01-23 MED ORDER — ONDANSETRON HCL 4 MG PO TABS: 4.0000 mg | ORAL_TABLET | Freq: Four times a day (QID) | ORAL | Status: DC | PRN

## 2013-01-23 MED ORDER — METOPROLOL TARTRATE 1 MG/ML IV SOLN
10.0000 mg | Freq: Four times a day (QID) | INTRAVENOUS | Status: DC
  Administered 2013-01-23 – 2013-01-26 (×12): 10 mg via INTRAVENOUS
  Filled 2013-01-23 (×20): qty 10

## 2013-01-23 MED ORDER — MORPHINE SULFATE 2 MG/ML IJ SOLN
2.0000 mg | Freq: Once | INTRAMUSCULAR | Status: AC
  Administered 2013-01-23: 2 mg via INTRAVENOUS
  Filled 2013-01-23: qty 1

## 2013-01-23 MED ORDER — ENOXAPARIN SODIUM 30 MG/0.3ML ~~LOC~~ SOLN
30.0000 mg | SUBCUTANEOUS | Status: DC
  Administered 2013-01-23 – 2013-01-26 (×4): 30 mg via SUBCUTANEOUS
  Filled 2013-01-23 (×5): qty 0.3

## 2013-01-23 NOTE — Consult Note (Signed)
Reason for Consult:  Large hiatal hernia possible causing GOO Referring Physician:  Dr. Faylene Million is an 79 y.o. female.  HPI:    She has been in declining health. Yesterday she developed severe abdominal pain with protracted nausea and vomiting that was not relieved by anything. She had had intermittent nausea and vomiting before.   An abdominal CT scan demonstrated a large hiatal hernia. The duodenum was decompressed. This suggested but did not confirm a possible gastric outlet obstruction. She is still having some upper abdominal pain.  Of note was that she was admitted to [facility] earlier this month due to weakness and dehydration. She also has community-acquired pneumonia and is being treated by her primary care physician for this.   Past Medical History  Diagnosis Date  . Hypertension   . Heart murmur   . Gout   . Hypothyroidism   . Afib   . Muscular degeneration   . Breast cancer, left breast   . Arthritis   . Peptic ulcer disease   . Anemia   . Hyperlipidemia   . Cancer     Left breast  . Renal insufficiency 10/12/2011  . Hx of echocardiogram     a. Echo 2/14:  EF 60-65%, mild AI, MAC, mild MR, mod to severe LAE, mild RAE, PASP 34    Past Surgical History  Procedure Laterality Date  . Cesarean section    . Appendectomy    . Upper gastrointestinal endoscopy    . Breast surgery      left  . Breast lumpectomy  october 2011    left     Family History  Problem Relation Age of Onset  . Cancer Sister     unknown  . Cancer Daughter     stomach cancer    Social History:  reports that she has never smoked. She has never used smokeless tobacco. She reports that she does not drink alcohol or use illicit drugs.  Allergies: No Known Allergies  Prior to Admission medications   Medication Sig Start Date End Date Taking? Authorizing Provider  allopurinol (ZYLOPRIM) 300 MG tablet Take 300 mg by mouth every morning.   Yes Historical Provider, MD   amiodarone (PACERONE) 200 MG tablet Take 200 mg by mouth daily.   Yes Historical Provider, MD  anastrozole (ARIMIDEX) 1 MG tablet Take 1 mg by mouth every morning.   Yes Historical Provider, MD  diltiazem (TIAZAC) 180 MG 24 hr capsule Take 180 mg by mouth every morning.   Yes Historical Provider, MD  levothyroxine (SYNTHROID, LEVOTHROID) 50 MCG tablet Take 50 mcg by mouth daily before breakfast.   Yes Historical Provider, MD  lisinopril-hydrochlorothiazide (PRINZIDE,ZESTORETIC) 20-12.5 MG per tablet Take 1 tablet by mouth every morning.   Yes Historical Provider, MD  Multiple Vitamins-Minerals (EYE-VITE EXTRA PO) Take 1 tablet by mouth 2 (two) times daily.    Yes Historical Provider, MD  omeprazole (PRILOSEC) 20 MG capsule Take 20 mg by mouth every morning.   Yes Historical Provider, MD  simvastatin (ZOCOR) 20 MG tablet Take 20 mg by mouth every evening.   Yes Historical Provider, MD  traMADol (ULTRAM) 50 MG tablet Take 50 mg by mouth every 6 (six) hours as needed for pain.    Yes Historical Provider, MD  zolpidem (AMBIEN) 5 MG tablet Take 2.5 mg by mouth at bedtime as needed for sleep.   Yes Historical Provider, MD     Results for orders placed during the hospital  encounter of 01/03/2013 (from the past 48 hour(s))  CBC WITH DIFFERENTIAL     Status: Abnormal   Collection Time    01/01/2013  6:50 PM      Result Value Range   WBC 11.7 (*) 4.0 - 10.5 K/uL   RBC 3.80 (*) 3.87 - 5.11 MIL/uL   Hemoglobin 11.3 (*) 12.0 - 15.0 g/dL   HCT 13.2 (*) 44.0 - 10.2 %   MCV 88.2  78.0 - 100.0 fL   MCH 29.7  26.0 - 34.0 pg   MCHC 33.7  30.0 - 36.0 g/dL   RDW 72.5 (*) 36.6 - 44.0 %   Platelets 329  150 - 400 K/uL   Neutrophils Relative % 91 (*) 43 - 77 %   Neutro Abs 10.6 (*) 1.7 - 7.7 K/uL   Lymphocytes Relative 3 (*) 12 - 46 %   Lymphs Abs 0.4 (*) 0.7 - 4.0 K/uL   Monocytes Relative 6  3 - 12 %   Monocytes Absolute 0.7  0.1 - 1.0 K/uL   Eosinophils Relative 0  0 - 5 %   Eosinophils Absolute 0.0  0.0  - 0.7 K/uL   Basophils Relative 0  0 - 1 %   Basophils Absolute 0.0  0.0 - 0.1 K/uL  COMPREHENSIVE METABOLIC PANEL     Status: Abnormal   Collection Time    01/21/2013  6:50 PM      Result Value Range   Sodium 132 (*) 135 - 145 mEq/L   Potassium 3.1 (*) 3.5 - 5.1 mEq/L   Chloride 94 (*) 96 - 112 mEq/L   CO2 22  19 - 32 mEq/L   Glucose, Bld 134 (*) 70 - 99 mg/dL   BUN 21  6 - 23 mg/dL   Creatinine, Ser 3.47 (*) 0.50 - 1.10 mg/dL   Calcium 8.9  8.4 - 42.5 mg/dL   Total Protein 6.1  6.0 - 8.3 g/dL   Albumin 2.7 (*) 3.5 - 5.2 g/dL   AST 21  0 - 37 U/L   ALT 15  0 - 35 U/L   Alkaline Phosphatase 128 (*) 39 - 117 U/L   Total Bilirubin 0.4  0.3 - 1.2 mg/dL   GFR calc non Af Amer 24 (*) >90 mL/min   GFR calc Af Amer 28 (*) >90 mL/min   Comment: (NOTE)     The eGFR has been calculated using the CKD EPI equation.     This calculation has not been validated in all clinical situations.     eGFR's persistently <90 mL/min signify possible Chronic Kidney     Disease.  LACTIC ACID, PLASMA     Status: None   Collection Time    01/20/2013  6:50 PM      Result Value Range   Lactic Acid, Venous 1.8  0.5 - 2.2 mmol/L  LIPASE, BLOOD     Status: None   Collection Time    01/24/2013  6:50 PM      Result Value Range   Lipase 23  11 - 59 U/L  PROTIME-INR     Status: None   Collection Time    01/26/2013  6:50 PM      Result Value Range   Prothrombin Time 13.4  11.6 - 15.2 seconds   INR 1.04  0.00 - 1.49  URINALYSIS, ROUTINE W REFLEX MICROSCOPIC     Status: None   Collection Time    01/02/2013  8:53 PM  Result Value Range   Color, Urine YELLOW  YELLOW   APPearance CLEAR  CLEAR   Specific Gravity, Urine 1.023  1.005 - 1.030   pH 5.5  5.0 - 8.0   Glucose, UA NEGATIVE  NEGATIVE mg/dL   Hgb urine dipstick NEGATIVE  NEGATIVE   Bilirubin Urine NEGATIVE  NEGATIVE   Ketones, ur NEGATIVE  NEGATIVE mg/dL   Protein, ur NEGATIVE  NEGATIVE mg/dL   Urobilinogen, UA 0.2  0.0 - 1.0 mg/dL   Nitrite NEGATIVE   NEGATIVE   Leukocytes, UA NEGATIVE  NEGATIVE   Comment: MICROSCOPIC NOT DONE ON URINES WITH NEGATIVE PROTEIN, BLOOD, LEUKOCYTES, NITRITE, OR GLUCOSE <1000 mg/dL.  POCT I-STAT TROPONIN I     Status: None   Collection Time    01/14/2013  9:44 PM      Result Value Range   Troponin i, poc 0.02  0.00 - 0.08 ng/mL   Comment 3            Comment: Due to the release kinetics of cTnI,     a negative result within the first hours     of the onset of symptoms does not rule out     myocardial infarction with certainty.     If myocardial infarction is still suspected,     repeat the test at appropriate intervals.  CBC     Status: Abnormal   Collection Time    01/23/13  4:10 AM      Result Value Range   WBC 12.8 (*) 4.0 - 10.5 K/uL   RBC 3.74 (*) 3.87 - 5.11 MIL/uL   Hemoglobin 10.9 (*) 12.0 - 15.0 g/dL   HCT 82.9 (*) 56.2 - 13.0 %   MCV 89.8  78.0 - 100.0 fL   MCH 29.1  26.0 - 34.0 pg   MCHC 32.4  30.0 - 36.0 g/dL   RDW 86.5 (*) 78.4 - 69.6 %   Platelets 307  150 - 400 K/uL  BASIC METABOLIC PANEL     Status: Abnormal   Collection Time    01/23/13  4:10 AM      Result Value Range   Sodium 136  135 - 145 mEq/L   Potassium 3.1 (*) 3.5 - 5.1 mEq/L   Chloride 97  96 - 112 mEq/L   CO2 22  19 - 32 mEq/L   Glucose, Bld 126 (*) 70 - 99 mg/dL   BUN 21  6 - 23 mg/dL   Creatinine, Ser 2.95 (*) 0.50 - 1.10 mg/dL   Calcium 8.7  8.4 - 28.4 mg/dL   GFR calc non Af Amer 26 (*) >90 mL/min   GFR calc Af Amer 30 (*) >90 mL/min   Comment: (NOTE)     The eGFR has been calculated using the CKD EPI equation.     This calculation has not been validated in all clinical situations.     eGFR's persistently <90 mL/min signify possible Chronic Kidney     Disease.    Ct Abdomen Pelvis Wo Contrast  01/16/2013   *RADIOLOGY REPORT*  Clinical Data: Nausea, vomiting, pneumonia, weakness.  CT ABDOMEN AND PELVIS WITHOUT CONTRAST  Technique:  Multidetector CT imaging of the abdomen and pelvis was performed following the  standard protocol without intravenous contrast.  Comparison: None.  Findings: Small bilateral pleural effusions with some adjacent consolidation / atelectasis in the visualized left lung base.  A large hiatal hernia.  The stomach is distended. Duodenum and distal bowel and colon decompressed.  A few scattered sigmoid and descending colon diverticula.  Unremarkable uninfused evaluation of liver, spleen, adrenal glands.  Marked bilateral renal parenchymal atrophy without focal lesion.  Mild pancreatic parenchymal atrophy without focal lesion. Patchy aortoiliac arterial calcifications without aneurysm.  Uterus and adnexal regions unremarkable.  Urinary bladder physiologically distended.  No ascites.  No free air.  Left pelvic phlebolith. Spondylitic changes throughout the lumbar spine.  IMPRESSION:  1.  Large hiatal hernia with marked gastric distention and a decompressed duodenum suggesting possible gastric outlet obstruction. 2.  Sigmoid and descending colon diverticulosis. 3.  Small bilateral pleural effusions.   Original Report Authenticated By: D. Andria Rhein, MD    Review of Systems  Constitutional: Positive for weight loss. Negative for fever and chills.  Respiratory: Negative for shortness of breath.   Cardiovascular: Negative for chest pain and palpitations.  Gastrointestinal: Positive for nausea, vomiting, abdominal pain and diarrhea. Negative for blood in stool.  Genitourinary: Negative for dysuria and hematuria.  Neurological:       Progressively increasing weakness. Having multiple falls thus her Coumadin was recently stopped.   Blood pressure 107/60, pulse 113, temperature 98.1 F (36.7 C), temperature source Oral, resp. rate 20, height 4\' 11"  (1.499 m), weight 108 lb 11 oz (49.3 kg), SpO2 100.00%. Physical Exam  Constitutional: No distress.  Elderly, weak appearing female.  HENT:  Head: Normocephalic and atraumatic.  Cardiovascular: Normal rate and regular rhythm.   Respiratory:   Decreased breath sounds at left base.  GI: Soft. She exhibits no mass. There is tenderness (in epigastrium).  Lower midline scar  Neurological: She is alert.  Skin: Skin is warm and dry.    Assessment/Plan: 1. Large hiatal hernia with possible gastric outlet obstruction. She has refused NG tube at this time. I discussed the importance of this to her and she is willing to try again.  2.  Progressive weakness in the recent past and failure to thrive.  3.  Community acquired pneumonia.  Rec:  Insert the NG tube for gastric decompression. Recommend GI consult to perform upper endoscopy and if she has a torsion and obstruction that could possibly relieve this.  Following this, and if she started off, upper GI. Currently, I feel she is 2 weeks to undergo a major operation. It may be that an EGD and a PEG for a gastropexy will be the best palliative maneuver.  Mackena Plummer J 01/23/2013, 7:35 AM

## 2013-01-23 NOTE — Progress Notes (Addendum)
Pt seen and examined, admitted this am by Dr.Akula with 1. N/V/Abd pain and possible Gastric outlet obstruction -Place NGT to ILWS -NPO, IVF -GI consult requested, seen by  in past, to eval for EGD -Start IV PPI, no h/o PUD per daughter  2. -HAs P.Afib : since NPO now, stop Amiodarone and cardizem and Start IV metoprolol -was on Warfarin this was stopped 2 weeks ago after admission at [facility], pt unclear on reason, will clarify with family  3. H/o Breast CA -hold arimidex  Zannie Cove, MD [phone]

## 2013-01-23 NOTE — H&P (Signed)
[facility] History and Physical  [name] [name] ZOX:[id number] DOB: 05-Apr-1924 DOA: 01/02/2013  Referring physician: Dr Wilkie Aye PCP: [name], MD  Specialists: none  Chief Complaint: nausea vomiting and abdominal pain since yesterday.  HPI: [name] [name] is a 79 y.o. female with multiple medical problems, came infor Nausea, vomiting and epigastric pain since yesterday. Vomiting, is non bloody, non bilious, associated with nausea. Epigastric pain, crampy in nature and tender to palpation. No fever or chills no diarrhea. On arrival to ED, she underwent CT of the abdomen showed gastric outlet obstruction. She was referred to medical service for admission.   Review of Systems: The patient denies anorexia, fever, weight loss,, vision loss, decreased hearing, hoarseness, chest pain, syncope, dyspnea on exertion, peripheral edema, balance deficits, hemoptysis,  melena, hematochezia,, hematuria, incontinence, genital sores, muscle weakness, suspicious skin lesions, transient blindness, difficulty walking, depression, unusual weight change, abnormal bleeding, enlarged lymph nodes, angioedema, and breast masses.    Past Medical History  Diagnosis Date  . Hypertension   . Heart murmur   . Gout   . Hypothyroidism   . Afib   . Muscular degeneration   . Breast cancer, left breast   . Arthritis   . Peptic ulcer disease   . Anemia   . Hyperlipidemia   . Cancer     Left breast  . Renal insufficiency 10/12/2011  . Hx of echocardiogram     a. Echo 2/14:  EF 60-65%, mild AI, MAC, mild MR, mod to severe LAE, mild RAE, PASP 34   Past Surgical History  Procedure Laterality Date  . Cesarean section    . Appendectomy    . Upper gastrointestinal endoscopy    . Breast surgery      left  . Breast lumpectomy  october 2011    left    Social History:  reports that she has never smoked. She has never used smokeless tobacco. She reports that she does not drink alcohol or use illicit drugs.  where  does patient live--home No Known Allergies  Family History  Problem Relation Age of Onset  . Cancer Sister     unknown  . Cancer Daughter     stomach cancer    Prior to Admission medications   Medication Sig Start Date End Date Taking? Authorizing Provider  allopurinol (ZYLOPRIM) 300 MG tablet Take 300 mg by mouth every morning.   Yes Historical Provider, MD  amiodarone (PACERONE) 200 MG tablet Take 200 mg by mouth daily.   Yes Historical Provider, MD  anastrozole (ARIMIDEX) 1 MG tablet Take 1 mg by mouth every morning.   Yes Historical Provider, MD  diltiazem (TIAZAC) 180 MG 24 hr capsule Take 180 mg by mouth every morning.   Yes Historical Provider, MD  levothyroxine (SYNTHROID, LEVOTHROID) 50 MCG tablet Take 50 mcg by mouth daily before breakfast.   Yes Historical Provider, MD  lisinopril-hydrochlorothiazide (PRINZIDE,ZESTORETIC) 20-12.5 MG per tablet Take 1 tablet by mouth every morning.   Yes Historical Provider, MD  Multiple Vitamins-Minerals (EYE-VITE EXTRA PO) Take 1 tablet by mouth 2 (two) times daily.    Yes Historical Provider, MD  omeprazole (PRILOSEC) 20 MG capsule Take 20 mg by mouth every morning.   Yes Historical Provider, MD  simvastatin (ZOCOR) 20 MG tablet Take 20 mg by mouth every evening.   Yes Historical Provider, MD  traMADol (ULTRAM) 50 MG tablet Take 50 mg by mouth every 6 (six) hours as needed for pain.    Yes Historical Provider,  MD  zolpidem (AMBIEN) 5 MG tablet Take 2.5 mg by mouth at bedtime as needed for sleep.   Yes Historical Provider, MD   Physical Exam: Filed Vitals:   2013-01-26 2357  BP: 118/66  Pulse:   Temp:   Resp: 13    Constitutional: Vital signs reviewed.  Patient is a well-developed and well-nourished  in no acute distress and cooperative with exam. Alert and oriented x3.  Head: Normocephalic and atraumatic Mouth: no erythema or exudates, MMM Eyes: PERRL, EOMI, conjunctivae normal, No scleral icterus.  Neck: Supple, Trachea midline  normal ROM, No JVD, mass, thyromegaly, or carotid bruit present.  Cardiovascular: RRR, S1 normal, S2 normal, no MRG, pulses symmetric and intact bilaterally Pulmonary/Chest: normal respiratory effort, CTAB, no wheezes, rales, or rhonchi Abdominal: Soft. Mild generalised tenderness more in the epigastric area, non-distended, bowel sounds are normal, no masses, organomegaly, or guarding present.  Musculoskeletal: No joint deformities, erythema, or stiffness, ROM full and no nontender Neurological: A&O x3, Strength is normal and symmetric bilaterally, cranial nerve II-XII are grossly intact, no focal motor deficit,  Skin:  Superficial skin tear over the right shin.  Psychiatric: Normal mood and affect. speech and behavior is normal..     Labs on Admission:  Basic Metabolic Panel:  Recent Labs Lab 01/17/13 1259 26-Jan-2013 1850  NA 123* 132*  K 4.2 3.1*  CL 94* 94*  CO2 15* 22  GLUCOSE 117* 134*  BUN 13 21  CREATININE 1.9* 1.82*  CALCIUM 9.5 8.9   Liver Function Tests:  Recent Labs Lab 2013-01-26 1850  AST 21  ALT 15  ALKPHOS 128*  BILITOT 0.4  PROT 6.1  ALBUMIN 2.7*    Recent Labs Lab 2013/01/26 1850  LIPASE 23   No results found for this basename: AMMONIA,  in the last 168 hours CBC:  Recent Labs Lab 01/17/13 1259 26-Jan-2013 1850  WBC 15.5* 11.7*  NEUTROABS 13.0* 10.6*  HGB 11.7* 11.3*  HCT 35.8* 33.5*  MCV 91.4 88.2  PLT 388.0 329   Cardiac Enzymes: No results found for this basename: CKTOTAL, CKMB, CKMBINDEX, TROPONINI,  in the last 168 hours  BNP (last 3 results)  Recent Labs  07/03/12 1626  PROBNP 365.0*   CBG: No results found for this basename: GLUCAP,  in the last 168 hours  Radiological Exams on Admission: Ct Abdomen Pelvis Wo Contrast  26-Jan-2013   *RADIOLOGY REPORT*  Clinical Data: Nausea, vomiting, pneumonia, weakness.  CT ABDOMEN AND PELVIS WITHOUT CONTRAST  Technique:  Multidetector CT imaging of the abdomen and pelvis was performed  following the standard protocol without intravenous contrast.  Comparison: None.  Findings: Small bilateral pleural effusions with some adjacent consolidation / atelectasis in the visualized left lung base.  A large hiatal hernia.  The stomach is distended. Duodenum and distal bowel and colon decompressed.  A few scattered sigmoid and descending colon diverticula.  Unremarkable uninfused evaluation of liver, spleen, adrenal glands.  Marked bilateral renal parenchymal atrophy without focal lesion.  Mild pancreatic parenchymal atrophy without focal lesion. Patchy aortoiliac arterial calcifications without aneurysm.  Uterus and adnexal regions unremarkable.  Urinary bladder physiologically distended.  No ascites.  No free air.  Left pelvic phlebolith. Spondylitic changes throughout the lumbar spine.  IMPRESSION:  1.  Large hiatal hernia with marked gastric distention and a decompressed duodenum suggesting possible gastric outlet obstruction. 2.  Sigmoid and descending colon diverticulosis. 3.  Small bilateral pleural effusions.   Original Report Authenticated By: D. Andria Rhein, MD  EKG: atrial fib with rate at 103/min  Assessment/Plan Active Problems: 1. Nausea, vomiting and abdominal pain: from gastric outlet obstruction. NG tube ordered for gastric decompression. Pt has been refusing so far and saying she will give permission to try it in the morning. The daughter and the patient aware of the consequences. Pt is alert and oriented . Gentle hydration and surgery consulted for further recommendations.  NPO .   2. Atrial fibrillation: rate controlled.   3. Stage 3 CKD: at baseline and stable.   4. Anemia: normocytic and stable.   5. Hypokalemia: replete as needed.   6. Breast cancer: further management as per oncology as outpatient.   6. DVT prophylaxis  Code Status: presumed full code Family Communication: none at bedside Disposition Plan: pending.  Time spent: 70 min  Khayman Kirsch [facility] Pager [phone]  If 7PM-7AM, please contact night-coverage [email or web address] Password [facility] 01/23/2013, 12:19 AM

## 2013-01-23 NOTE — Progress Notes (Signed)
Clinical Social Work Department BRIEF PSYCHOSOCIAL ASSESSMENT 01/23/2013  Patient:  [name] [name], [name] [name]     Account Number:  [id number]     Admit date:  01/13/2013  Clinical Social Worker:  Hattie Perch  Date/Time:  01/23/2013 12:00 M  Referred by:  RN  Date Referred:  01/23/2013 Referred for  SNF Placement   Other Referral:   Interview type:  Patient Other interview type:    PSYCHOSOCIAL DATA Living Status:  ALONE Admitted from facility:   Level of care:   Primary support name:  Cherylynn Ridges Primary support relationship to patient:  CHILD, ADULT Degree of support available:   good    CURRENT CONCERNS Current Concerns  Post-Acute Placement   Other Concerns:    SOCIAL WORK ASSESSMENT / PLAN CSW met with patient. patient is alert and oriented X3. RN asked CSW to speak with patient as she is concerned that patient will be unable to return home by herself upon discharge. CSW discussed same with patient. she also thinks she will need some rehab. she would like to go in [place] if possible. patient currently has NG tube and is possibly going to have surgery. Patient is hopeful to feel better soon.   Assessment/plan status:   Other assessment/ plan:   Information/referral to community resources:    PATIENT'S/FAMILY'S RESPONSE TO PLAN OF CARE: patient hopeful to feel better soon but realistic about probably needing rehab. CSW to fax out and follow for disposition.

## 2013-01-23 NOTE — Telephone Encounter (Signed)
i was reviewing notes just now.

## 2013-01-23 NOTE — Telephone Encounter (Signed)
Patient had an appointment today.  Her friend,Irene,called and cancelled the appointment.  Patient was admitted to [facility] yesterday.

## 2013-01-23 NOTE — Consult Note (Signed)
Referring Provider: No ref. provider found Primary Care Physician:  Spencer Copland, MD Primary Gastroenterologist:  Dr.  Reason for Consultation:  GOO  HPI: [name] [name] is a 79 y.o. female with PMH of HTN, hyperlipidemia, hypothyroidism, gout, afib (not currently on anti-coagulation), renal insufficiency, and breast cancer.  She presented to [facility] ED with complaints of nausea, vomiting, and epigastric pain since the day prior (Monday).  This was sudden in onset and she had been eating well, etc prior to this.  Emesis is non-bloody.  Epigastric pain is crampy in nature and tender to palpation; had abdominal distention as well. No fever or chills.  Last BM was Monday.  On arrival to ED she underwent CT scan of the abdomen and pelvis, which showed the following:  IMPRESSION:  1. Large hiatal hernia with marked gastric distention and a  decompressed duodenum suggesting possible gastric outlet  obstruction.  2. Sigmoid and descending colon diverticulosis.  3. Small bilateral pleural effusions.   Was also found to have a mild leukocytosis and hypokalemia.  An NGT has been placed this morning and within a couple of hours an entire canister has been filled with brown fluid.  Feels much better with NGT and is not complaining of abdominal pain for nausea.  It appears that she was seen once in our office by Dr. Gessner in 2008 and had an EGD on 10/05/2006 and colonoscopy 09/27/2006.  EGD showed gastric erosions/small ulcerations and a large hiatal hernia with Cameron's lesions.  Colonoscopy revealed diverticulosis in the left colon and two polyps removed from sigmoid colon.  Has been on daily PPI at home.   Past Medical History  Diagnosis Date  . Hypertension   . Heart murmur   . Gout   . Hypothyroidism   . Afib   . Muscular degeneration   . Breast cancer, left breast   . Arthritis   . Peptic ulcer disease   . Anemia   . Hyperlipidemia   . Cancer     Left breast  . Renal insufficiency  10/12/2011  . Hx of echocardiogram     a. Echo 2/14:  EF 60-65%, mild AI, MAC, mild MR, mod to severe LAE, mild RAE, PASP 34    Past Surgical History  Procedure Laterality Date  . Cesarean section    . Appendectomy    . Upper gastrointestinal endoscopy    . Breast surgery      left  . Breast lumpectomy  october 2011    left     Prior to Admission medications   Medication Sig Start Date End Date Taking? Authorizing Provider  allopurinol (ZYLOPRIM) 300 MG tablet Take 300 mg by mouth every morning.   Yes Historical Provider, MD  amiodarone (PACERONE) 200 MG tablet Take 200 mg by mouth daily.   Yes Historical Provider, MD  anastrozole (ARIMIDEX) 1 MG tablet Take 1 mg by mouth every morning.   Yes Historical Provider, MD  diltiazem (TIAZAC) 180 MG 24 hr capsule Take 180 mg by mouth every morning.   Yes Historical Provider, MD  levothyroxine (SYNTHROID, LEVOTHROID) 50 MCG tablet Take 50 mcg by mouth daily before breakfast.   Yes Historical Provider, MD  lisinopril-hydrochlorothiazide (PRINZIDE,ZESTORETIC) 20-12.5 MG per tablet Take 1 tablet by mouth every morning.   Yes Historical Provider, MD  Multiple Vitamins-Minerals (EYE-VITE EXTRA PO) Take 1 tablet by mouth 2 (two) times daily.    Yes Historical Provider, MD  omeprazole (PRILOSEC) 20 MG capsule Take 20 mg by mouth   every morning.   Yes Historical Provider, MD  simvastatin (ZOCOR) 20 MG tablet Take 20 mg by mouth every evening.   Yes Historical Provider, MD  traMADol (ULTRAM) 50 MG tablet Take 50 mg by mouth every 6 (six) hours as needed for pain.    Yes Historical Provider, MD  zolpidem (AMBIEN) 5 MG tablet Take 2.5 mg by mouth at bedtime as needed for sleep.   Yes Historical Provider, MD    Current Facility-Administered Medications  Medication Dose Route Frequency Provider Last Rate Last Dose  . enoxaparin (LOVENOX) injection 30 mg  30 mg Subcutaneous Q24H Vijaya Akula, MD      . metoprolol (LOPRESSOR) injection 10 mg  10 mg  Intravenous Q6H Preetha Joseph, MD      . ondansetron (ZOFRAN) tablet 4 mg  4 mg Oral Q6H PRN Vijaya Akula, MD       Or  . ondansetron (ZOFRAN) injection 4 mg  4 mg Intravenous Q6H PRN Vijaya Akula, MD   4 mg at 01/23/13 0236  . pantoprazole (PROTONIX) injection 40 mg  40 mg Intravenous QHS Vijaya Akula, MD   40 mg at 01/23/13 0142  . sodium chloride 0.9 % 1,000 mL with potassium chloride 40 mEq infusion   Intravenous Continuous Preetha Joseph, MD      . sodium chloride 0.9 % injection 3 mL  3 mL Intravenous Q12H Vijaya Akula, MD   3 mL at 01/23/13 0030    Allergies as of 01/05/2013  . (No Known Allergies)    Family History  Problem Relation Age of Onset  . Cancer Sister     unknown  . Cancer Daughter     stomach cancer    History   Social History  . Marital Status: Widowed    Spouse Name: N/A    Number of Children: N/A  . Years of Education: N/A   Occupational History  . retired 7-11    Social History Main Topics  . Smoking status: Never Smoker   . Smokeless tobacco: Never Used  . Alcohol Use: No  . Drug Use: No  . Sexual Activity: Not on file   Other Topics Concern  . Not on file   Social History Narrative  . No narrative on file    Review of Systems: Ten point ROS is O/W negative except as mentioned in HPI.  Physical Exam: Vital signs in last 24 hours: Temp:  [97.1 F (36.2 C)-98.1 F (36.7 C)] 98.1 F (36.7 C) (08/27 0525) Pulse Rate:  [96-113] 113 (08/27 0525) Resp:  [13-21] 20 (08/27 0525) BP: (106-128)/(60-68) 107/60 mmHg (08/27 0525) SpO2:  [97 %-100 %] 100 % (08/27 0525) Weight:  [108 lb 11 oz (49.3 kg)] 108 lb 11 oz (49.3 kg) (08/27 0017) Last BM Date: 12/31/2012 General:   Alert, elderly white female, pleasant and cooperative in NAD Head:  Normocephalic and atraumatic. Eyes:  Sclera clear, no icterus.  Conjunctiva pink. Ears:  Normal auditory acuity. Mouth:  No deformity or lesions.   Lungs:  Clear throughout to auscultation.  No wheezes,  crackles, or rhonchi.  Heart:  Tachy but regular rhythm.   Abdomen:  Soft, non-distended.  BS present but quiet.  Minimal epigastric TTP without R/R/G.   Rectal:  Deferred  Msk:  Symmetrical without gross deformities. Pulses:  Normal pulses noted. Extremities:  Without clubbing or edema. Neurologic:  Alert and  oriented x4;  grossly normal neurologically. Skin:  Intact without significant lesions or rashes.  Has bandage on right   lower leg due to an abrasion. Psych:  Alert and cooperative. Normal mood and affect.  Intake/Output from previous day: 08/26 0701 - 08/27 0700 In: 435 [I.V.:435] Out: 50 [Urine:50] Intake/Output this shift: Total I/O In: -  Out: 100 [Urine:100]  Lab Results:  Recent Labs  01/21/2013 1850 01/23/13 0410  WBC 11.7* 12.8*  HGB 11.3* 10.9*  HCT 33.5* 33.6*  PLT 329 307   BMET  Recent Labs  01/21/2013 1850 01/23/13 0410  NA 132* 136  K 3.1* 3.1*  CL 94* 97  CO2 22 22  GLUCOSE 134* 126*  BUN 21 21  CREATININE 1.82* 1.71*  CALCIUM 8.9 8.7   LFT  Recent Labs  01/25/2013 1850  PROT 6.1  ALBUMIN 2.7*  AST 21  ALT 15  ALKPHOS 128*  BILITOT 0.4   PT/INR  Recent Labs  01/03/2013 1850  LABPROT 13.4  INR 1.04   Studies/Results: Ct Abdomen Pelvis Wo Contrast  01/17/2013   *RADIOLOGY REPORT*  Clinical Data: Nausea, vomiting, pneumonia, weakness.  CT ABDOMEN AND PELVIS WITHOUT CONTRAST  Technique:  Multidetector CT imaging of the abdomen and pelvis was performed following the standard protocol without intravenous contrast.  Comparison: None.  Findings: Small bilateral pleural effusions with some adjacent consolidation / atelectasis in the visualized left lung base.  A large hiatal hernia.  The stomach is distended. Duodenum and distal bowel and colon decompressed.  A few scattered sigmoid and descending colon diverticula.  Unremarkable uninfused evaluation of liver, spleen, adrenal glands.  Marked bilateral renal parenchymal atrophy without focal  lesion.  Mild pancreatic parenchymal atrophy without focal lesion. Patchy aortoiliac arterial calcifications without aneurysm.  Uterus and adnexal regions unremarkable.  Urinary bladder physiologically distended.  No ascites.  No free air.  Left pelvic phlebolith. Spondylitic changes throughout the lumbar spine.  IMPRESSION:  1.  Large hiatal hernia with marked gastric distention and a decompressed duodenum suggesting possible gastric outlet obstruction. 2.  Sigmoid and descending colon diverticulosis. 3.  Small bilateral pleural effusions.   Original Report Authenticated By: D. Hassell III, MD    IMPRESSION:  -Nausea/vomiting/epigastric abdominal pain with possible GOO in a patient with history of gastric erosions/small ulcerations on EGD in 2008.  Symptoms improved with NGT decompression. -Large hiatal hernia with associated Cameron's lesions also seen  on EGD in 2008.  -Atrial fibrillation:  Not currently on anti-coagulation. -Stage 3 CKD  -History of breast cancer -Mild hypokalemia:  Repletion per primary service.  PLAN: -Will tentatively plan for EGD tomorrow, 8/28.   -Continue NGT and supportive care for now with anti-emetics, IV PPI daily, etc.  ZEHR, JESSICA D.  01/23/2013, 10:34 AM  Pager number [phone]  Chart was reviewed and patient was examined. X-rays and lab were reviewed.    I agree with management and plans.  xrays look like GOO.  Suspect scarring from PUD although malignancy should be r/oed.  Doubt lianitis plastica.  Will proceed with EGD on 8/28.  Micah Barnier D. Ezequias Lard, M.D., FACG [facility] Cell [phone]       

## 2013-01-24 ENCOUNTER — Encounter (HOSPITAL_COMMUNITY): Admission: EM | Disposition: E | Payer: Self-pay | Source: Home / Self Care | Attending: Internal Medicine

## 2013-01-24 ENCOUNTER — Encounter (HOSPITAL_COMMUNITY): Payer: Self-pay | Admitting: Gastroenterology

## 2013-01-24 DIAGNOSIS — K219 Gastro-esophageal reflux disease without esophagitis: Secondary | ICD-10-CM

## 2013-01-24 DIAGNOSIS — K449 Diaphragmatic hernia without obstruction or gangrene: Secondary | ICD-10-CM

## 2013-01-24 DIAGNOSIS — K2970 Gastritis, unspecified, without bleeding: Secondary | ICD-10-CM

## 2013-01-24 DIAGNOSIS — I4891 Unspecified atrial fibrillation: Secondary | ICD-10-CM

## 2013-01-24 HISTORY — PX: ESOPHAGOGASTRODUODENOSCOPY: SHX5428

## 2013-01-24 LAB — BASIC METABOLIC PANEL
BUN: 25 mg/dL — ABNORMAL HIGH (ref 6–23)
Calcium: 8.6 mg/dL (ref 8.4–10.5)
Chloride: 108 mEq/L (ref 96–112)
Creatinine, Ser: 1.56 mg/dL — ABNORMAL HIGH (ref 0.50–1.10)
GFR calc Af Amer: 33 mL/min — ABNORMAL LOW (ref >90)

## 2013-01-24 LAB — CBC
HCT: 32 % — ABNORMAL LOW (ref 36.0–46.0)
MCH: 30.5 pg (ref 26.0–34.0)
MCHC: 33.1 g/dL (ref 30.0–36.0)
MCV: 92 fL (ref 78.0–100.0)
RDW: 21.7 % — ABNORMAL HIGH (ref 11.5–15.5)
WBC: 13.5 10*3/uL — ABNORMAL HIGH (ref 4.0–10.5)

## 2013-01-24 SURGERY — EGD (ESOPHAGOGASTRODUODENOSCOPY)
Anesthesia: Moderate Sedation

## 2013-01-24 MED ORDER — ZOLPIDEM TARTRATE 5 MG PO TABS
5.0000 mg | ORAL_TABLET | Freq: Once | ORAL | Status: AC
  Administered 2013-01-24: 5 mg via ORAL
  Filled 2013-01-24: qty 1

## 2013-01-24 MED ORDER — MIDAZOLAM HCL 10 MG/2ML IJ SOLN
INTRAMUSCULAR | Status: DC | PRN
  Administered 2013-01-24: 2 mg via INTRAVENOUS
  Administered 2013-01-24 (×3): 1 mg via INTRAVENOUS

## 2013-01-24 MED ORDER — BUTAMBEN-TETRACAINE-BENZOCAINE 2-2-14 % EX AERO
INHALATION_SPRAY | CUTANEOUS | Status: DC | PRN
  Administered 2013-01-24: 2 via TOPICAL

## 2013-01-24 MED ORDER — FENTANYL CITRATE 0.05 MG/ML IJ SOLN
INTRAMUSCULAR | Status: DC | PRN
  Administered 2013-01-24 (×2): 25 ug via INTRAVENOUS

## 2013-01-24 NOTE — Progress Notes (Signed)
ATTENDING ADDENDUM:  I personally reviewed patient's record, examined the patient, and formulated the following assessment and plan:  PT feeling better.  NO stomach pain.  Will await EGD once GI feels it is appropriate.  Cont decompression for now

## 2013-01-24 NOTE — Interval H&P Note (Signed)
History and Physical Interval Note:  01/25/2013 11:41 AM  [name] [name]  has presented today for surgery, with the diagnosis of GOO  The various methods of treatment have been discussed with the patient and family. After consideration of risks, benefits and other options for treatment, the patient has consented to  Procedure(s): ESOPHAGOGASTRODUODENOSCOPY (EGD) (N/A) as a surgical intervention .  The patient's history has been reviewed, patient examined, no change in status, stable for surgery.  I have reviewed the patient's chart and labs.  Questions were answered to the patient's satisfaction.     The recent H&P (dated *01/23/13**) was reviewed, the patient was examined and there is no change in the patients condition since that H&P was completed.   [name]  01/05/2013, 11:42 AM   [name]

## 2013-01-24 NOTE — Progress Notes (Signed)
Patient ID: [name] [name], female   DOB: 15-Feb-1924, 79 y.o.   MRN: [id number]    Subjective: No complaints, denies pain/n/v  Objective: Vital signs in last 24 hours: Temp:  [97.4 F (36.3 C)-98.5 F (36.9 C)] 98.4 F (36.9 C) (08/28 0536) Pulse Rate:  [76-88] 76 (08/28 0536) Resp:  [18] 18 (08/28 0536) BP: (99-119)/(46-63) 99/46 mmHg (08/28 0536) SpO2:  [93 %-95 %] 95 % (08/28 0536) Last BM Date: 01/25/2013  Intake/Output from previous day: 08/27 0701 - 08/28 0700 In: 1406.3 [I.V.:1406.3] Out: 2070 [Urine:400; Emesis/NG output:1670] Intake/Output this shift: Total I/O In: -  Out: 1 [Stool:1]  PE: Abd: soft, nontender, +BS NGT 1670cc General: NAD  Lab Results:   Recent Labs  01/23/13 0410 01/25/2013 0405  WBC 12.8* 13.5*  HGB 10.9* 10.6*  HCT 33.6* 32.0*  PLT 307 267   BMET  Recent Labs  01/23/13 0410 01/05/2013 0405  NA 136 141  K 3.1* 4.1  CL 97 108  CO2 22 20  GLUCOSE 126* 95  BUN 21 25*  CREATININE 1.71* 1.56*  CALCIUM 8.7 8.6   PT/INR  Recent Labs  01/12/2013 1850  LABPROT 13.4  INR 1.04   CMP     Component Value Date/Time   NA 141 01/23/2013 0405   K 4.1 01/03/2013 0405   CL 108 01/10/2013 0405   CO2 20 01/19/2013 0405   GLUCOSE 95 01/12/2013 0405   BUN 25* 01/19/2013 0405   CREATININE 1.56* 01/01/2013 0405   CALCIUM 8.6 12/29/2012 0405   PROT 6.1 01/12/2013 1850   ALBUMIN 2.7* 12/30/2012 1850   AST 21 01/21/2013 1850   ALT 15 01/02/2013 1850   ALKPHOS 128* 12/30/2012 1850   BILITOT 0.4 01/09/2013 1850   GFRNONAA 29* 01/21/2013 0405   GFRAA 33* 01/24/2013 0405   Lipase     Component Value Date/Time   LIPASE 23 01/03/2013 1850       Studies/Results: Ct Abdomen Pelvis Wo Contrast  01/15/2013   *RADIOLOGY REPORT*  Clinical Data: Nausea, vomiting, pneumonia, weakness.  CT ABDOMEN AND PELVIS WITHOUT CONTRAST  Technique:  Multidetector CT imaging of the abdomen and pelvis was performed following the standard protocol without intravenous contrast.   Comparison: None.  Findings: Small bilateral pleural effusions with some adjacent consolidation / atelectasis in the visualized left lung base.  A large hiatal hernia.  The stomach is distended. Duodenum and distal bowel and colon decompressed.  A few scattered sigmoid and descending colon diverticula.  Unremarkable uninfused evaluation of liver, spleen, adrenal glands.  Marked bilateral renal parenchymal atrophy without focal lesion.  Mild pancreatic parenchymal atrophy without focal lesion. Patchy aortoiliac arterial calcifications without aneurysm.  Uterus and adnexal regions unremarkable.  Urinary bladder physiologically distended.  No ascites.  No free air.  Left pelvic phlebolith. Spondylitic changes throughout the lumbar spine.  IMPRESSION:  1.  Large hiatal hernia with marked gastric distention and a decompressed duodenum suggesting possible gastric outlet obstruction. 2.  Sigmoid and descending colon diverticulosis. 3.  Small bilateral pleural effusions.   Original Report Authenticated By: D. Andria Rhein, MD    Anti-infectives: Anti-infectives   None       Assessment/Plan 1. Large hiatal hernia with possible gastric outlet obstruction.   2. Progressive weakness in the recent past and failure to thrive.   3. Community acquired pneumonia.   Rec: Upper endoscopy this AM and if she has a torsion and obstruction that could possibly relieve this. Following this she should have a swallow  study tomorrow.  It may be that an EGD and a PEG for a gastropexy will be the best palliative maneuver.   LOS: 2 days    [name] [name] 01/18/2013

## 2013-01-24 NOTE — H&P (View-Only) (Signed)
Referring Provider: No ref. provider found Primary Care Physician:  [name], MD Primary Gastroenterologist:  Dr.  Jaquita Rector for Consultation:  GOO  HPI: [name] [name] is a 79 y.o. female with PMH of HTN, hyperlipidemia, hypothyroidism, gout, afib (not currently on anti-coagulation), renal insufficiency, and breast cancer.  She presented to [facility] ED with complaints of nausea, vomiting, and epigastric pain since the day prior (Monday).  This was sudden in onset and she had been eating well, etc prior to this.  Emesis is non-bloody.  Epigastric pain is crampy in nature and tender to palpation; had abdominal distention as well. No fever or chills.  Last BM was Monday.  On arrival to ED she underwent CT scan of the abdomen and pelvis, which showed the following:  IMPRESSION:  1. Large hiatal hernia with marked gastric distention and a  decompressed duodenum suggesting possible gastric outlet  obstruction.  2. Sigmoid and descending colon diverticulosis.  3. Small bilateral pleural effusions.   Was also found to have a mild leukocytosis and hypokalemia.  An NGT has been placed this morning and within a couple of hours an entire canister has been filled with brown fluid.  Feels much better with NGT and is not complaining of abdominal pain for nausea.  It appears that she was seen once in our office by Dr. Leone Payor in 2008 and had an EGD on 10/05/2006 and colonoscopy 09/27/2006.  EGD showed gastric erosions/small ulcerations and a large hiatal hernia with Cameron's lesions.  Colonoscopy revealed diverticulosis in the left colon and two polyps removed from sigmoid colon.  Has been on daily PPI at home.   Past Medical History  Diagnosis Date  . Hypertension   . Heart murmur   . Gout   . Hypothyroidism   . Afib   . Muscular degeneration   . Breast cancer, left breast   . Arthritis   . Peptic ulcer disease   . Anemia   . Hyperlipidemia   . Cancer     Left breast  . Renal insufficiency  10/12/2011  . Hx of echocardiogram     a. Echo 2/14:  EF 60-65%, mild AI, MAC, mild MR, mod to severe LAE, mild RAE, PASP 34    Past Surgical History  Procedure Laterality Date  . Cesarean section    . Appendectomy    . Upper gastrointestinal endoscopy    . Breast surgery      left  . Breast lumpectomy  october 2011    left     Prior to Admission medications   Medication Sig Start Date End Date Taking? Authorizing Provider  allopurinol (ZYLOPRIM) 300 MG tablet Take 300 mg by mouth every morning.   Yes Historical Provider, MD  amiodarone (PACERONE) 200 MG tablet Take 200 mg by mouth daily.   Yes Historical Provider, MD  anastrozole (ARIMIDEX) 1 MG tablet Take 1 mg by mouth every morning.   Yes Historical Provider, MD  diltiazem (TIAZAC) 180 MG 24 hr capsule Take 180 mg by mouth every morning.   Yes Historical Provider, MD  levothyroxine (SYNTHROID, LEVOTHROID) 50 MCG tablet Take 50 mcg by mouth daily before breakfast.   Yes Historical Provider, MD  lisinopril-hydrochlorothiazide (PRINZIDE,ZESTORETIC) 20-12.5 MG per tablet Take 1 tablet by mouth every morning.   Yes Historical Provider, MD  Multiple Vitamins-Minerals (EYE-VITE EXTRA PO) Take 1 tablet by mouth 2 (two) times daily.    Yes Historical Provider, MD  omeprazole (PRILOSEC) 20 MG capsule Take 20 mg by mouth  every morning.   Yes Historical Provider, MD  simvastatin (ZOCOR) 20 MG tablet Take 20 mg by mouth every evening.   Yes Historical Provider, MD  traMADol (ULTRAM) 50 MG tablet Take 50 mg by mouth every 6 (six) hours as needed for pain.    Yes Historical Provider, MD  zolpidem (AMBIEN) 5 MG tablet Take 2.5 mg by mouth at bedtime as needed for sleep.   Yes Historical Provider, MD    Current Facility-Administered Medications  Medication Dose Route Frequency Provider Last Rate Last Dose  . enoxaparin (LOVENOX) injection 30 mg  30 mg Subcutaneous Q24H Kathlen Mody, MD      . metoprolol (LOPRESSOR) injection 10 mg  10 mg  Intravenous Q6H Zannie Cove, MD      . ondansetron [facility]) tablet 4 mg  4 mg Oral Q6H PRN Kathlen Mody, MD       Or  . ondansetron (ZOFRAN) injection 4 mg  4 mg Intravenous Q6H PRN Kathlen Mody, MD   4 mg at 01/23/13 0236  . pantoprazole (PROTONIX) injection 40 mg  40 mg Intravenous QHS Kathlen Mody, MD   40 mg at 01/23/13 0142  . sodium chloride 0.9 % 1,000 mL with potassium chloride 40 mEq infusion   Intravenous Continuous Zannie Cove, MD      . sodium chloride 0.9 % injection 3 mL  3 mL Intravenous Q12H Kathlen Mody, MD   3 mL at 01/23/13 0030    Allergies as of 01/26/2013  . (No Known Allergies)    Family History  Problem Relation Age of Onset  . Cancer Sister     unknown  . Cancer Daughter     stomach cancer    History   Social History  . Marital Status: Widowed    Spouse Name: N/A    Number of Children: N/A  . Years of Education: N/A   Occupational History  . retired 7-11    Social History Main Topics  . Smoking status: Never Smoker   . Smokeless tobacco: Never Used  . Alcohol Use: No  . Drug Use: No  . Sexual Activity: Not on file   Other Topics Concern  . Not on file   Social History Narrative  . No narrative on file    Review of Systems: Ten point ROS is O/W negative except as mentioned in HPI.  Physical Exam: Vital signs in last 24 hours: Temp:  [97.1 F (36.2 C)-98.1 F (36.7 C)] 98.1 F (36.7 C) (08/27 0525) Pulse Rate:  [96-113] 113 (08/27 0525) Resp:  [13-21] 20 (08/27 0525) BP: (106-128)/(60-68) 107/60 mmHg (08/27 0525) SpO2:  [97 %-100 %] 100 % (08/27 0525) Weight:  [108 lb 11 oz (49.3 kg)] 108 lb 11 oz (49.3 kg) (08/27 0017) Last BM Date: 01/05/2013 General:   Alert, elderly white female, pleasant and cooperative in NAD Head:  Normocephalic and atraumatic. Eyes:  Sclera clear, no icterus.  Conjunctiva pink. Ears:  Normal auditory acuity. Mouth:  No deformity or lesions.   Lungs:  Clear throughout to auscultation.  No wheezes,  crackles, or rhonchi.  Heart:  Tachy but regular rhythm.   Abdomen:  Soft, non-distended.  BS present but quiet.  Minimal epigastric TTP without R/R/G.   Rectal:  Deferred  Msk:  Symmetrical without gross deformities. Pulses:  Normal pulses noted. Extremities:  Without clubbing or edema. Neurologic:  Alert and  oriented x4;  grossly normal neurologically. Skin:  Intact without significant lesions or rashes.  Has bandage on right  lower leg due to an abrasion. Psych:  Alert and cooperative. Normal mood and affect.  Intake/Output from previous day: 08/26 0701 - 08/27 0700 In: 435 [I.V.:435] Out: 50 [Urine:50] Intake/Output this shift: Total I/O In: -  Out: 100 [Urine:100]  Lab Results:  Recent Labs  01/16/2013 1850 01/23/13 0410  WBC 11.7* 12.8*  HGB 11.3* 10.9*  HCT 33.5* 33.6*  PLT 329 307   BMET  Recent Labs  01/23/2013 1850 01/23/13 0410  NA 132* 136  K 3.1* 3.1*  CL 94* 97  CO2 22 22  GLUCOSE 134* 126*  BUN 21 21  CREATININE 1.82* 1.71*  CALCIUM 8.9 8.7   LFT  Recent Labs  01/14/2013 1850  PROT 6.1  ALBUMIN 2.7*  AST 21  ALT 15  ALKPHOS 128*  BILITOT 0.4   PT/INR  Recent Labs  01/16/2013 1850  LABPROT 13.4  INR 1.04   Studies/Results: Ct Abdomen Pelvis Wo Contrast  01/14/2013   *RADIOLOGY REPORT*  Clinical Data: Nausea, vomiting, pneumonia, weakness.  CT ABDOMEN AND PELVIS WITHOUT CONTRAST  Technique:  Multidetector CT imaging of the abdomen and pelvis was performed following the standard protocol without intravenous contrast.  Comparison: None.  Findings: Small bilateral pleural effusions with some adjacent consolidation / atelectasis in the visualized left lung base.  A large hiatal hernia.  The stomach is distended. Duodenum and distal bowel and colon decompressed.  A few scattered sigmoid and descending colon diverticula.  Unremarkable uninfused evaluation of liver, spleen, adrenal glands.  Marked bilateral renal parenchymal atrophy without focal  lesion.  Mild pancreatic parenchymal atrophy without focal lesion. Patchy aortoiliac arterial calcifications without aneurysm.  Uterus and adnexal regions unremarkable.  Urinary bladder physiologically distended.  No ascites.  No free air.  Left pelvic phlebolith. Spondylitic changes throughout the lumbar spine.  IMPRESSION:  1.  Large hiatal hernia with marked gastric distention and a decompressed duodenum suggesting possible gastric outlet obstruction. 2.  Sigmoid and descending colon diverticulosis. 3.  Small bilateral pleural effusions.   Original Report Authenticated By: D. Andria Rhein, MD    IMPRESSION:  -Nausea/vomiting/epigastric abdominal pain with possible GOO in a patient with history of gastric erosions/small ulcerations on EGD in 2008.  Symptoms improved with NGT decompression. -Large hiatal hernia with associated Cameron's lesions also seen  on EGD in 2008.  -Atrial fibrillation:  Not currently on anti-coagulation. -Stage 3 CKD  -History of breast cancer -Mild hypokalemia:  Repletion per primary service.  PLAN: -Will tentatively plan for EGD tomorrow, 8/28.   -Continue NGT and supportive care for now with anti-emetics, IV PPI daily, etc.  ZEHR, JESSICA D.  01/23/2013, 10:34 AM  Pager number [phone]  Chart was reviewed and patient was examined. X-rays and lab were reviewed.    I agree with management and plans.  xrays look like GOO.  Suspect scarring from PUD although malignancy should be r/oed.  Doubt lianitis plastica.  Will proceed with EGD on 8/28.  Barbette Hair. Arlyce Dice, M.D., [facility] Cell [phone]

## 2013-01-24 NOTE — Progress Notes (Signed)
Upper endoscopy demonstrates a large hernia with retained gastric contents in the hiatal hernia sac.  Findings are suggestive of a paraesophageal hiatal hernia.  The distal stomach is patent including the pylorus.  Recommendations #1 upper GI series for further delineation of anatomy

## 2013-01-24 NOTE — Progress Notes (Addendum)
[facility] PROGRESS NOTE  [name] [name] XBJ:[id number] DOB: [birth year] DOA: January 23, 2013 PCP: [name], MD  Assessment/Plan: 1. N/V/Abd pain and possible Gastric outlet obstruction  -Improved with NG decompression -NPO, IVF  -GI following for EGD today  -Continue IV PPI, no h/o PUD per daughter   2.  P.Afib : since NPO now,  Amiodarone and cardizem on hold, and cotninue  IV metoprolol  -was on Warfarin this was stopped 2 weeks ago after admission at [facility], pt unclear on reason, will clarify with family   3. H/o Breast CA  -hold arimidex   4. CKD3: -stable  5. Anemia: -stable, normocytic  DVT proph: lovenox  Code Status: full code per H&P, will readdress Family Communication:none at bedside today Disposition Plan: may need SNF  Consultants:  [facility]  [facility]  HPI/Subjective: Feels better, NG output down  Objective: Filed Vitals:   01/23/2013 1300  BP: 127/50  Pulse: 71  Temp: 97.2 F (36.2 C)  Resp: 18    Intake/Output Summary (Last 24 hours) at 01/26/2013 1314 Last data filed at 01/06/2013 1040  Gross per 24 hour  Intake 1406.25 ml  Output    972 ml  Net 434.25 ml   Filed Weights   01/23/13 0017  Weight: 49.3 kg (108 lb 11 oz)    Exam:   General:  AAOx3, NG in situ  Cardiovascular: S1S2/RRR  Respiratory: CTAB  Abdomen: soft, Nt, BS present  Musculoskeletal: no edema c/c  Data Reviewed: Basic Metabolic Panel:  Recent Labs Lab 01/23/2013 1850 01/23/13 0410 01/23/2013 0405  NA 132* 136 141  K 3.1* 3.1* 4.1  CL 94* 97 108  CO2 22 22 20   GLUCOSE 134* 126* 95  BUN 21 21 25*  CREATININE 1.82* 1.71* 1.56*  CALCIUM 8.9 8.7 8.6   Liver Function Tests:  Recent Labs Lab 01/23/13 1850  AST 21  ALT 15  ALKPHOS 128*  BILITOT 0.4  PROT 6.1  ALBUMIN 2.7*    Recent Labs Lab 23-Jan-2013 1850  LIPASE 23   No results found for this basename: AMMONIA,  in the last 168 hours CBC:  Recent Labs Lab 01-23-2013 1850 01/23/13 0410  01/03/2013 0405  WBC 11.7* 12.8* 13.5*  NEUTROABS 10.6*  --   --   HGB 11.3* 10.9* 10.6*  HCT 33.5* 33.6* 32.0*  MCV 88.2 89.8 92.0  PLT 329 307 267   Cardiac Enzymes: No results found for this basename: CKTOTAL, CKMB, CKMBINDEX, TROPONINI,  in the last 168 hours BNP (last 3 results)  Recent Labs  07/03/12 1626  PROBNP 365.0*   CBG: No results found for this basename: GLUCAP,  in the last 168 hours  Recent Results (from the past 240 hour(s))  STOOL CULTURE     Status: None   Collection Time    01/18/13  8:38 AM      Result Value Range Status   Organism ID, Bacteria No Salmonella,Shigella,Campylobacter,Yersinia,or   Final   Organism ID, Bacteria No E.coli 0157:H7 isolated.   Final   Comment: Reduced Normal Flora present  CLOSTRIDIUM DIFFICILE EIA     Status: None   Collection Time    01/18/13  8:45 AM      Result Value Range Status   CDIFTX NEGATIVE   Final     Studies: Ct Abdomen Pelvis Wo Contrast  2013/01/23   *RADIOLOGY REPORT*  Clinical Data: Nausea, vomiting, pneumonia, weakness.  CT ABDOMEN AND PELVIS WITHOUT CONTRAST  Technique:  Multidetector CT imaging of the  abdomen and pelvis was performed following the standard protocol without intravenous contrast.  Comparison: None.  Findings: Small bilateral pleural effusions with some adjacent consolidation / atelectasis in the visualized left lung base.  A large hiatal hernia.  The stomach is distended. Duodenum and distal bowel and colon decompressed.  A few scattered sigmoid and descending colon diverticula.  Unremarkable uninfused evaluation of liver, spleen, adrenal glands.  Marked bilateral renal parenchymal atrophy without focal lesion.  Mild pancreatic parenchymal atrophy without focal lesion. Patchy aortoiliac arterial calcifications without aneurysm.  Uterus and adnexal regions unremarkable.  Urinary bladder physiologically distended.  No ascites.  No free air.  Left pelvic phlebolith. Spondylitic changes throughout the  lumbar spine.  IMPRESSION:  1.  Large hiatal hernia with marked gastric distention and a decompressed duodenum suggesting possible gastric outlet obstruction. 2.  Sigmoid and descending colon diverticulosis. 3.  Small bilateral pleural effusions.   Original Report Authenticated By: D. Andria Rhein, MD    Scheduled Meds: . enoxaparin (LOVENOX) injection  30 mg Subcutaneous Q24H  . metoprolol  10 mg Intravenous Q6H  . pantoprazole (PROTONIX) IV  40 mg Intravenous QHS  . sodium chloride  3 mL Intravenous Q12H   Continuous Infusions: . sodium chloride 0.9 % 1,000 mL with potassium chloride 40 mEq infusion 75 mL/hr at January 28, 2013 0022    Active Problems:   HYPOTHYROIDISM   HYPERTENSION   PEPTIC ULCER DISEASE   Gastric outlet obstruction   Hypokalemia   Hiatal hernia   Unspecified gastritis and gastroduodenitis without mention of hemorrhage    Time spent:    [facility]  [facility] Pager [phone]. If 7PM-7AM, please contact night-coverage at [email or web address], password [facility] 02/24/2013, 1:14 PM  LOS: 2 days

## 2013-01-24 NOTE — Op Note (Signed)
[facility] [address] [place], [address]   ENDOSCOPY PROCEDURE REPORT  PATIENT: [name] [name], [name] [name]  MR#: [id number] BIRTHDATE: [birth year] , 89  yrs. old GENDER: Female ENDOSCOPIST: Louis Meckel, MD REFERRED BY:  Juleen China, M.D. PROCEDURE DATE:  01/19/2013 PROCEDURE:  EGD w/ biopsy ASA CLASS:     Class III INDICATIONS:  gastric outlet obstruction. MEDICATIONS: These medications were titrated to patient response per physician's verbal order, Versed 5 mg IV, and Fentanyl 50 mcg IV TOPICAL ANESTHETIC: Cetacaine Spray  DESCRIPTION OF PROCEDURE: After the risks benefits and alternatives of the procedure were thoroughly explained, informed consent was obtained.  The Pentax Gastroscope [id number] endoscope was introduced through the mouth and advanced to the   . Without limitations.  The instrument was slowly withdrawn as the mucosa was fully examined.      There was a very large hiatal hernia present with a significant amount of retained gastric contents in the hiatal hernia sac.  On retroflex view the hernia had the appearance of a paraesophageal hiatal hernia although I cannot be certain.  There was marked erythema in the gastric antrum.  The pylorus was widely patent. Biopsies were taken of the antrum.  Second and third portions of the duodenum were normal.    Second and third portions of the duodenum were normal.  Retroflexed views revealed no abnormalities. The scope was then withdrawn from the patient and the procedure completed.  COMPLICATIONS: There were no complications. ENDOSCOPIC IMPRESSION: 1.   Retained gastric contents and a large hiatal hernia-rule out paraesophageal hernia with obstruction 2.  antral gastritis  RECOMMENDATIONS: upper GI series REPEAT EXAM:  eSigned:  Louis Meckel, MD 01/05/2013 12:26 PM   CC:

## 2013-01-25 ENCOUNTER — Encounter (HOSPITAL_COMMUNITY): Payer: Self-pay | Admitting: Gastroenterology

## 2013-01-25 ENCOUNTER — Inpatient Hospital Stay (HOSPITAL_COMMUNITY): Payer: Medicare Other

## 2013-01-25 DIAGNOSIS — K449 Diaphragmatic hernia without obstruction or gangrene: Secondary | ICD-10-CM

## 2013-01-25 LAB — CBC
HCT: 28 % — ABNORMAL LOW (ref 36.0–46.0)
MCHC: 32.1 g/dL (ref 30.0–36.0)
RDW: 22.5 % — ABNORMAL HIGH (ref 11.5–15.5)

## 2013-01-25 LAB — BASIC METABOLIC PANEL
BUN: 30 mg/dL — ABNORMAL HIGH (ref 6–23)
Creatinine, Ser: 1.33 mg/dL — ABNORMAL HIGH (ref 0.50–1.10)
GFR calc Af Amer: 40 mL/min — ABNORMAL LOW (ref >90)
GFR calc non Af Amer: 35 mL/min — ABNORMAL LOW (ref >90)
Potassium: 5 mEq/L (ref 3.5–5.1)

## 2013-01-25 MED ORDER — FUROSEMIDE 10 MG/ML IJ SOLN
40.0000 mg | Freq: Once | INTRAMUSCULAR | Status: AC
  Administered 2013-01-25: 40 mg via INTRAVENOUS

## 2013-01-25 MED ORDER — FUROSEMIDE 10 MG/ML IJ SOLN
INTRAMUSCULAR | Status: AC
  Filled 2013-01-25: qty 4

## 2013-01-25 MED ORDER — FUROSEMIDE 10 MG/ML IJ SOLN
40.0000 mg | Freq: Once | INTRAMUSCULAR | Status: AC
  Administered 2013-01-25: 40 mg via INTRAVENOUS
  Filled 2013-01-25: qty 4

## 2013-01-25 MED ORDER — MORPHINE SULFATE 2 MG/ML IJ SOLN
1.0000 mg | Freq: Once | INTRAMUSCULAR | Status: AC
  Administered 2013-01-25: 1 mg via INTRAVENOUS
  Filled 2013-01-25: qty 1

## 2013-01-25 NOTE — Progress Notes (Signed)
[facility] Progress Note  Subjective:  No more nausea, vomiting, or pain even after NGT removed.  Objective:  Vital signs in last 24 hours: Temp:  [97.2 F (36.2 C)-98 F (36.7 C)] 98 F (36.7 C) (08/29 1610) Pulse Rate:  [69-95] 79 (08/29 0633) Resp:  [13-36] 20 (08/29 9604) BP: (75-152)/(41-75) 137/57 mmHg (08/29 0633) SpO2:  [88 %-100 %] 96 % (08/29 5409) Last BM Date: 02/10/13 General:   Alert, elderly white female, in NAD Heart:  Regular rate and rhythm; no murmurs Pulm:  CTAB.  No W/R/R. Abdomen:  Soft, non-tender and non-distended. Normal bowel sounds, without guarding, and without rebound.   Extremities:  Without edema.  Bandages on right leg to cover abrasions. Neurologic:  Alert and  oriented x4;  grossly normal neurologically. Psych:  Alert and cooperative. Normal mood and affect.  Intake/Output from previous day: 08/28 0701 - 08/29 0700 In: 0  Out: 127 [Urine:125; Stool:2]  Lab Results:  Recent Labs  01/23/13 0410 02-10-2013 0405 01/25/13 0455  WBC 12.8* 13.5* 10.6*  HGB 10.9* 10.6* 9.0*  HCT 33.6* 32.0* 28.0*  PLT 307 267 255   BMET  Recent Labs  01/23/13 0410 2013-02-10 0405 01/25/13 0455  NA 136 141 142  K 3.1* 4.1 5.0  CL 97 108 116*  CO2 22 20 16*  GLUCOSE 126* 95 75  BUN 21 25* 30*  CREATININE 1.71* 1.56* 1.33*  CALCIUM 8.7 8.6 8.6   LFT  Recent Labs  01/13/2013 1850  PROT 6.1  ALBUMIN 2.7*  AST 21  ALT 15  ALKPHOS 128*  BILITOT 0.4   PT/INR  Recent Labs  12/29/2012 1850  LABPROT 13.4  INR 1.04   Assessment / Plan: -Large hiatal hernia with retained gastric contents in the hernia sac seen on EGD 8/28 c/w paraesophageal hiatal hernia.  Distal stomach and pylorus were patent.  *Await results of of UGI study.    LOS: 3 days   ZEHR, JESSICA D.  01/25/2013, 9:14 AM  Pager number [phone]  Upper GI series verifies that most of her stomach is in the chest.  Some barium does get into the duodenum but I suspect that  functional obstruction will recur.  Will talk to general surgery about therapeutic options.

## 2013-01-25 NOTE — Progress Notes (Signed)
Patient ID: [name] [name], female   DOB: 1924/04/24, 79 y.o.   MRN: [id number] 1 Day Post-Op  Subjective: No complaints, denies pain/n/v  Objective: Vital signs in last 24 hours: Temp:  [97.2 F (36.2 C)-98 F (36.7 C)] 98 F (36.7 C) (08/29 7253) Pulse Rate:  [69-95] 79 (08/29 0633) Resp:  [13-36] 20 (08/29 6644) BP: (75-152)/(41-75) 137/57 mmHg (08/29 0633) SpO2:  [88 %-100 %] 96 % (08/29 0633) Last BM Date: 01/17/2013  Intake/Output from previous day: 08/28 0701 - 08/29 0700 In: 0  Out: 127 [Urine:125; Stool:2] Intake/Output this shift:    PE: Abd: soft, nontender, +BS General: NAD   Lab Results:   Recent Labs  01/26/2013 0405 01/25/13 0455  WBC 13.5* 10.6*  HGB 10.6* 9.0*  HCT 32.0* 28.0*  PLT 267 255   BMET  Recent Labs  12/31/2012 0405 01/25/13 0455  NA 141 142  K 4.1 5.0  CL 108 116*  CO2 20 16*  GLUCOSE 95 75  BUN 25* 30*  CREATININE 1.56* 1.33*  CALCIUM 8.6 8.6   PT/INR  Recent Labs  2013-02-15 1850  LABPROT 13.4  INR 1.04   CMP     Component Value Date/Time   NA 142 01/25/2013 0455   K 5.0 01/25/2013 0455   CL 116* 01/25/2013 0455   CO2 16* 01/25/2013 0455   GLUCOSE 75 01/25/2013 0455   BUN 30* 01/25/2013 0455   CREATININE 1.33* 01/25/2013 0455   CALCIUM 8.6 01/25/2013 0455   PROT 6.1 2013/02/15 1850   ALBUMIN 2.7* Feb 15, 2013 1850   AST 21 2013/02/15 1850   ALT 15 2013/02/15 1850   ALKPHOS 128* 2013/02/15 1850   BILITOT 0.4 February 15, 2013 1850   GFRNONAA 35* 01/25/2013 0455   GFRAA 40* 01/25/2013 0455   Lipase     Component Value Date/Time   LIPASE 23 02/15/2013 1850       Studies/Results: No results found.  Anti-infectives: Anti-infectives   None       Assessment/Plan 1. Large hiatal hernia with possible gastric outlet obstruction.   2. Progressive weakness in the recent past and failure to thrive.   3. Community acquired pneumonia.   Rec: Upper endoscopy demonstrates a large hernia with retained gastric contents in the hiatal  hernia sac. Findings are suggestive of a paraesophageal hiatal hernia. The distal stomach is patent including the pylorus.    For UGI today to better define the anatomy  It may be that an EGD and PEG for a gastropexy will be the best palliative maneuver.   LOS: 3 days    Shawnise Peterkin 01/25/2013

## 2013-01-25 NOTE — Progress Notes (Addendum)
[facility] PROGRESS NOTE  [name] [name] XBJ:[id number] DOB: Oct 24, 1923 DOA: 01/21/2013 PCP: [name], MD  Assessment/Plan: 1. N/V/Abd pain and possible Gastric outlet obstruction  -Improved with NG decompression, NG out yesterday after EGD -EGD 8/28 with Retained gastric contents and large hiatal hernia widely patent pylorus/distal stomach -Upper GI series today to better delineate anatomy -trial of clears later this afternoon pending UGI series  -cut down IVF , IV PPI  2.  P.Afib : since NPO now,  Amiodarone and cardizem on hold, and continue  IV metoprolol  -was on Warfarin this was stopped 2 weeks ago after admission at [facility], per daughter due to frequent falls and risk.  3. H/o Breast CA  -hold arimidex   4. CKD3: -stable  5. Anemia: -stable, normocytic  DVT proph: lovenox Ambulate, PT  Code Status: full code per H&P, will readdress Family Communication:none at bedside today Disposition Plan: may need SNF  Consultants:  [facility]  [facility]  HPI/Subjective: Feels better, NG out, hungry wants to eat and waiting for GI series  Objective: Filed Vitals:   01/25/13 0633  BP: 137/57  Pulse: 79  Temp: 98 F (36.7 C)  Resp: 20    Intake/Output Summary (Last 24 hours) at 01/25/13 1256 Last data filed at 01/25/13 0900  Gross per 24 hour  Intake      0 ml  Output    205 ml  Net   -205 ml   Filed Weights   01/23/13 0017  Weight: 49.3 kg (108 lb 11 oz)    Exam:   General:  AAOx3, NG in situ  Cardiovascular: S1S2/RRR  Respiratory: diminished BS at bases  Abdomen: soft, Nt, BS present  Musculoskeletal: no edema c/c  Data Reviewed: Basic Metabolic Panel:  Recent Labs Lab 01/17/2013 1850 01/23/13 0410 Jan 25, 2013 0405 01/25/13 0455  NA 132* 136 141 142  K 3.1* 3.1* 4.1 5.0  CL 94* 97 108 116*  CO2 22 22 20  16*  GLUCOSE 134* 126* 95 75  BUN 21 21 25* 30*  CREATININE 1.82* 1.71* 1.56* 1.33*  CALCIUM 8.9 8.7 8.6 8.6   Liver Function  Tests:  Recent Labs Lab 01/24/2013 1850  AST 21  ALT 15  ALKPHOS 128*  BILITOT 0.4  PROT 6.1  ALBUMIN 2.7*    Recent Labs Lab 01/07/2013 1850  LIPASE 23   No results found for this basename: AMMONIA,  in the last 168 hours CBC:  Recent Labs Lab 01/08/2013 1850 01/23/13 0410 01/25/2013 0405 01/25/13 0455  WBC 11.7* 12.8* 13.5* 10.6*  NEUTROABS 10.6*  --   --   --   HGB 11.3* 10.9* 10.6* 9.0*  HCT 33.5* 33.6* 32.0* 28.0*  MCV 88.2 89.8 92.0 93.0  PLT 329 307 267 255   Cardiac Enzymes: No results found for this basename: CKTOTAL, CKMB, CKMBINDEX, TROPONINI,  in the last 168 hours BNP (last 3 results)  Recent Labs  07/03/12 1626  PROBNP 365.0*   CBG: No results found for this basename: GLUCAP,  in the last 168 hours  Recent Results (from the past 240 hour(s))  STOOL CULTURE     Status: None   Collection Time    01/18/13  8:38 AM      Result Value Range Status   Organism ID, Bacteria No Salmonella,Shigella,Campylobacter,Yersinia,or   Final   Organism ID, Bacteria No E.coli 0157:H7 isolated.   Final   Comment: Reduced Normal Flora present  CLOSTRIDIUM DIFFICILE EIA     Status: None  Collection Time    01/18/13  8:45 AM      Result Value Range Status   CDIFTX NEGATIVE   Final     Studies: Dg Ugi W/high Density W/kub  01/25/2013   *RADIOLOGY REPORT*  Clinical Data:  Possible gastric outlet obstruction.  Abdominal pain.  UPPER GI SERIES WITH KUB  Technique:  Routine upper GI series was performed with thin barium.  Fluoroscopy Time: 1 minute 2 seconds  Comparison:  CT abdomen and pelvis 01/01/2013.  Findings:  Single contrast examination was performed.  The study is somewhat limited by the patient's lack of mobility and difficulty consuming contrast material.  Scout film demonstrates a normal bowel gas pattern.  Severe convex right scoliosis and severe right hip degenerative disease are identified.  As seen on CT scan, the patient has an intrathoracic stomach. There is  some spontaneous reflux of contrast material into the distal esophagus.  The patient was placed with her right side down but contrast was slow to exit the stomach.  Therefore, a KUB was obtained 1 hour after the study was performed and contrast material is in small bowel loops on that image.  IMPRESSION:  1.  Limited study is negative for gastric outlet obstruction. 2.  Intrathoracic stomach. 3.  Gastroesophageal reflux. 4.  Severe convex right scoliosis and right hip osteoarthritis.   Original Report Authenticated By: Holley Dexter, M.D.    Scheduled Meds: . enoxaparin (LOVENOX) injection  30 mg Subcutaneous Q24H  . metoprolol  10 mg Intravenous Q6H  . pantoprazole (PROTONIX) IV  40 mg Intravenous QHS  . sodium chloride  3 mL Intravenous Q12H   Continuous Infusions: . sodium chloride 0.9 % 1,000 mL with potassium chloride 40 mEq infusion 75 mL/hr at 01/25/13 0404    Active Problems:   HYPOTHYROIDISM   HYPERTENSION   PEPTIC ULCER DISEASE   Gastric outlet obstruction   Hypokalemia   Hiatal hernia   Unspecified gastritis and gastroduodenitis without mention of hemorrhage    Time spent:    [facility]  [facility] Pager [phone]. If 7PM-7AM, please contact night-coverage at [email or web address], password [facility] 01/25/2013, 12:56 PM  LOS: 3 days

## 2013-01-25 NOTE — Progress Notes (Signed)
ATTENDING ADDENDUM:  I personally reviewed patient's record, examined the patient, and formulated the following assessment and plan:  UGI shows no obstruction.  Ok to start clears.  If she can tolerate a diet, she can go home and f/u with Dr Abbey Chatters as an outpatient for discussion of surgical options.

## 2013-01-25 NOTE — Progress Notes (Signed)
CSW continues to follow. When diet advances, CSW to fax out.  Nashid Pellum C. Breaker Springer MSW, LCSW [phone]

## 2013-01-26 ENCOUNTER — Inpatient Hospital Stay (HOSPITAL_COMMUNITY): Payer: Medicare Other

## 2013-01-26 DIAGNOSIS — I509 Heart failure, unspecified: Secondary | ICD-10-CM

## 2013-01-26 DIAGNOSIS — I5031 Acute diastolic (congestive) heart failure: Secondary | ICD-10-CM | POA: Diagnosis not present

## 2013-01-26 DIAGNOSIS — R0602 Shortness of breath: Secondary | ICD-10-CM

## 2013-01-26 LAB — CBC
Hemoglobin: 10.3 g/dL — ABNORMAL LOW (ref 12.0–15.0)
MCHC: 31.8 g/dL (ref 30.0–36.0)
RDW: 22.4 % — ABNORMAL HIGH (ref 11.5–15.5)

## 2013-01-26 LAB — BASIC METABOLIC PANEL
Chloride: 108 mEq/L (ref 96–112)
GFR calc Af Amer: 33 mL/min — ABNORMAL LOW (ref >90)
GFR calc Af Amer: 25 mL/min — ABNORMAL LOW (ref >90)
GFR calc non Af Amer: 28 mL/min — ABNORMAL LOW (ref >90)
Potassium: 5.4 mEq/L — ABNORMAL HIGH (ref 3.5–5.1)
Potassium: 5.5 mEq/L — ABNORMAL HIGH (ref 3.5–5.1)
Sodium: 138 mEq/L (ref 135–145)
Sodium: 139 mEq/L (ref 135–145)

## 2013-01-26 MED ORDER — SODIUM POLYSTYRENE SULFONATE 15 GM/60ML PO SUSP
15.0000 g | Freq: Once | ORAL | Status: AC
  Administered 2013-01-26: 15 g via ORAL
  Filled 2013-01-26: qty 60

## 2013-01-26 MED ORDER — TECHNETIUM TC 99M DIETHYLENETRIAME-PENTAACETIC ACID: 42.9000 | Freq: Once | INTRAVENOUS | Status: AC | PRN

## 2013-01-26 MED ORDER — TECHNETIUM TO 99M ALBUMIN AGGREGATED
5.4000 | Freq: Once | INTRAVENOUS | Status: AC | PRN
  Administered 2013-01-26: 5 via INTRAVENOUS

## 2013-01-26 MED ORDER — SODIUM CHLORIDE 0.9 % IV BOLUS (SEPSIS)
250.0000 mL | Freq: Once | INTRAVENOUS | Status: AC
  Administered 2013-01-26: 23:00:00 250 mL via INTRAVENOUS

## 2013-01-26 MED ORDER — FUROSEMIDE 10 MG/ML IJ SOLN
40.0000 mg | Freq: Two times a day (BID) | INTRAMUSCULAR | Status: DC
  Administered 2013-01-26: 40 mg via INTRAVENOUS
  Filled 2013-01-26 (×4): qty 4

## 2013-01-26 MED ORDER — MORPHINE SULFATE 2 MG/ML IJ SOLN
0.5000 mg | Freq: Once | INTRAMUSCULAR | Status: AC
  Administered 2013-01-26: 10:00:00 0.5 mg via INTRAVENOUS
  Filled 2013-01-26: qty 1

## 2013-01-26 NOTE — Evaluation (Signed)
Physical Therapy Evaluation Patient Details Name: [name] [name] MRN: [id number] DOB: May 04, 1924 Today's Date: 01/26/2013 Time: 1210-1222 PT Time Calculation (min): 12 min  PT Assessment / Plan / Recommendation History of Present Illness  79 y.o. female admitted to [facility] on 01/10/2013 with hiatal hernia s/u upper endoscopy and NG tube for decompression.  She is now having difficulty breathing, on O2 [place] and going for VQ scan.  LE dopplers (-) for DVT.    Clinical Impression  Pt is very weak, deconditioned and 2/4 DOE at rest in the bed on O2 via [place].  She did not feel like getting up and is being transferred imminently to have VQ scan to r/o PE (she is not currently on blood thinners, so it is likely better that we don't get her up until results either r/o PE or she is started on blood thinner in the event that she does have a PE).  Pt does not have assist at discharge and PTA used a RW to get around.  She will likely need placement for SNF for rehab prior to returning home.  PT to follow acutely to progress mobility and update d/c recs as needed.     PT Assessment  Patient needs continued PT services    Follow Up Recommendations  SNF    Does the patient have the potential to tolerate intense rehabilitation     NA  Barriers to Discharge Decreased caregiver support no 24/7 assist at discharge    Equipment Recommendations  3in1 (PT);Other (comment) (shower chair with back)    Recommendations for Other Services   None  Frequency Min 3X/week    Precautions / Restrictions Precautions Precautions: Sternal Precaution Comments: very DOE even with general conversation.  VQ scan today to f/o PE, monitor DOE and sats as able.    Pertinent Vitals/Pain See vitals flow sheet.       Mobility  Bed Mobility Bed Mobility: Not assessed (pt did not feel like getting up today and is headed to VQ ) Transfers Transfers: Not assessed (pt did not feel like getting up today and is headed to VQ  ) Ambulation/Gait Ambulation/Gait Assistance: Not tested (comment) (pt did not feel like getting up today and is headed to VQ )        PT Diagnosis: Difficulty walking;Abnormality of gait;Generalized weakness;Acute pain  PT Problem List: Decreased strength;Decreased activity tolerance;Decreased balance;Decreased mobility;Decreased knowledge of use of DME;Pain;Cardiopulmonary status limiting activity PT Treatment Interventions: DME instruction;Gait training;Functional mobility training;Therapeutic activities;Balance training;Therapeutic exercise;Neuromuscular re-education;Stair training;Patient/family education     PT Goals(Current goals can be found in the care plan section) Acute Rehab PT Goals Patient Stated Goal: to go home PT Goal Formulation: With patient/family Time For Goal Achievement: 02/09/13 Potential to Achieve Goals: Good  Visit Information  Last PT Received On: 01/26/13 History of Present Illness: 79 y.o. female admitted to [facility] on 01/02/2013 with hiatal hernia s/u upper endoscopy and NG tube for decompression.  She is now having difficulty breathing, on O2 [place] and going for VQ scan.  LE dopplers (-) for DVT.         Prior Functioning  Home Living Family/patient expects to be discharged to:: Private residence Living Arrangements: Alone Available Help at Discharge: Family;Available PRN/intermittently (son lives in [address], but no 24/7 assist.  ) Type of Home: House Home Access: Stairs to enter [employer] of Steps: 4 Entrance Stairs-Rails: Right;Left Home Layout: One level Home Equipment: Environmental consultant - 2 wheels;Cane - quad;Grab bars - tub/shower Prior Function  Level of Independence: Independent with assistive device(s) Comments: Pt did not do yard work, but generally cared for herself.   Communication Communication: HOH Dominant Hand: Right    Cognition  Cognition Arousal/Alertness: Awake/alert Behavior During Therapy: WFL for tasks  assessed/performed Overall Cognitive Status: Within Functional Limits for tasks assessed    Extremity/Trunk Assessment Upper Extremity Assessment Upper Extremity Assessment: Generalized weakness (upper extremities weaker than legs) Lower Extremity Assessment Lower Extremity Assessment: Generalized weakness Cervical / Trunk Assessment Cervical / Trunk Assessment: Normal      End of Session PT - End of Session Equipment Utilized During Treatment: Oxygen Activity Tolerance: Patient limited by fatigue;Patient limited by pain;Treatment limited secondary to medical complications (Comment) (limited by DOE) Patient left: in bed;with call bell/phone within reach;with family/visitor present   Lurena Joiner B. Jacoby Zanni, PT, DPT [phone]   01/26/2013, 12:33 PM

## 2013-01-26 NOTE — Progress Notes (Addendum)
[facility] PROGRESS NOTE  MORNING HALBERG ZOX:[id number] DOB: [birth year] DOA: 01/26/2013 PCP: [name], MD  Assessment/Plan: 1. Functional  Gastric obstruction from large Thoracic Hiatal hernia -Improved with NG decompression, NG out 8/28 after EGD -EGD 8/28 with Retained gastric contents and large hiatal hernia widely patent pylorus/distal stomach -Upper GI series with large intrathoracic hiatal hernia likely causing intermittent functional obstruction -per [facility]CS, FU with Dr.Rosenbauer if able to tolerate PO to eval for surgical options -keep on clears today, off IVF given resp issues  2. Mild resp distress/diastolic CHF -likely volume overload related, continue IV lasix today -also check Dopplers and VQ scan to r/o VTE  3.  P.Afib : -  Amiodarone and cardizem on hold, and continue  IV metoprolol  - resume Po meds tomorrow if Po intake reliable and resp issues stable -was on Warfarin this was stopped 2 weeks ago after admission at [facility], per daughter due to frequent falls and risk.  4. H/o Breast CA  -hold arimidex   5. CKD3: -stable  6. Anemia: -stable, normocytic  7. Hyperkalemia -not getting any supplemental K -kayexalate 15gms now  DVT proph: lovenox Ambulate, PT  Code Status: full code per H&P Family Communication: d/w daughter at bedside today Disposition Plan: may need SNF  Consultants:  [facility]  [facility]  HPI/Subjective: Still with shortness of breath  Objective: Filed Vitals:   01/26/13 0538  BP: 116/46  Pulse: 77  Temp:   Resp:     Intake/Output Summary (Last 24 hours) at 01/26/13 0831 Last data filed at 01/26/13 0245  Gross per 24 hour  Intake      3 ml  Output    280 ml  Net   -277 ml   Filed Weights   01/23/13 0017  Weight: 49.3 kg (108 lb 11 oz)    Exam:   General:  AAOx3, mild resp distress  Cardiovascular: S1S2/RRR  Respiratory: crackled at bases  Abdomen: soft, Nt, BS present  Musculoskeletal: no edema  c/c  Data Reviewed: Basic Metabolic Panel:  Recent Labs Lab 01/23/2013 1850 01/23/13 0410 01/02/2013 0405 01/25/13 0455 01/26/13 0511  NA 132* 136 141 142 138  K 3.1* 3.1* 4.1 5.0 5.4*  CL 94* 97 108 116* 109  CO2 22 22 20  16* 16*  GLUCOSE 134* 126* 95 75 119*  BUN 21 21 25* 30* 40*  CREATININE 1.82* 1.71* 1.56* 1.33* 1.59*  CALCIUM 8.9 8.7 8.6 8.6 9.3   Liver Function Tests:  Recent Labs Lab 01/24/2013 1850  AST 21  ALT 15  ALKPHOS 128*  BILITOT 0.4  PROT 6.1  ALBUMIN 2.7*    Recent Labs Lab 12/31/2012 1850  LIPASE 23   No results found for this basename: AMMONIA,  in the last 168 hours CBC:  Recent Labs Lab January 27, 2013 1850 01/23/13 0410 01/17/2013 0405 01/25/13 0455 01/26/13 0511  WBC 11.7* 12.8* 13.5* 10.6* 12.0*  NEUTROABS 10.6*  --   --   --   --   HGB 11.3* 10.9* 10.6* 9.0* 10.3*  HCT 33.5* 33.6* 32.0* 28.0* 32.4*  MCV 88.2 89.8 92.0 93.0 93.1  PLT 329 307 267 255 267   Cardiac Enzymes: No results found for this basename: CKTOTAL, CKMB, CKMBINDEX, TROPONINI,  in the last 168 hours BNP (last 3 results)  Recent Labs  07/03/12 1626  PROBNP 365.0*   CBG: No results found for this basename: GLUCAP,  in the last 168 hours  Recent Results (from the past 240 hour(s))  STOOL  CULTURE     Status: None   Collection Time    01/18/13  8:38 AM      Result Value Range Status   Organism ID, Bacteria No Salmonella,Shigella,Campylobacter,Yersinia,or   Final   Organism ID, Bacteria No E.coli [id number]:H7 isolated.   Final   Comment: Reduced Normal Flora present  CLOSTRIDIUM DIFFICILE EIA     Status: None   Collection Time    01/18/13  8:45 AM      Result Value Range Status   CDIFTX NEGATIVE   Final     Studies: Dg Chest Port 1 View  01/25/2013   *RADIOLOGY REPORT*  Clinical Data: Respiratory distress  PORTABLE CHEST - 1 VIEW  Comparison: 01/09/2013 and upper GI 01/25/2013  Findings:  Cardiomegaly again noted.  Bilateral probable small pleural effusions bilateral  basilar atelectasis or infiltrate.  Again noted intrathoracic stomach.  There is residual barium material in the proximal stomach which is located in the retrocardiac position.  No convincing pulmonary edema.  IMPRESSION: Cardiomegaly.  Bilateral small pleural effusion with bilateral basilar atelectasis or infiltrate.  Residual contrast material noted in proximal stomach. Again noted with diaphragmatic hernia with intrathoracic stomach.   Original Report Authenticated By: Natasha Mead, M.D.   Varney Biles Kayleen Memos W/high Density W/kub  01/25/2013   *RADIOLOGY REPORT*  Clinical Data:  Possible gastric outlet obstruction.  Abdominal pain.  UPPER GI SERIES WITH KUB  Technique:  Routine upper GI series was performed with thin barium.  Fluoroscopy Time: 1 minute 2 seconds  Comparison:  CT abdomen and pelvis 01/23/2013.  Findings:  Single contrast examination was performed.  The study is somewhat limited by the patient's lack of mobility and difficulty consuming contrast material.  Scout film demonstrates a normal bowel gas pattern.  Severe convex right scoliosis and severe right hip degenerative disease are identified.  As seen on CT scan, the patient has an intrathoracic stomach. There is some spontaneous reflux of contrast material into the distal esophagus.  The patient was placed with her right side down but contrast was slow to exit the stomach.  Therefore, a KUB was obtained 1 hour after the study was performed and contrast material is in small bowel loops on that image.  IMPRESSION:  1.  Limited study is negative for gastric outlet obstruction. 2.  Intrathoracic stomach. 3.  Gastroesophageal reflux. 4.  Severe convex right scoliosis and right hip osteoarthritis.   Original Report Authenticated By: Holley Dexter, M.D.    Scheduled Meds: . enoxaparin (LOVENOX) injection  30 mg Subcutaneous Q24H  . furosemide  40 mg Intravenous Q12H  . metoprolol  10 mg Intravenous Q6H  .  morphine injection  0.5 mg Intravenous Once  .  pantoprazole (PROTONIX) IV  40 mg Intravenous QHS  . sodium chloride  3 mL Intravenous Q12H  . sodium polystyrene  15 g Oral Once   Continuous Infusions:    Active Problems:   HYPOTHYROIDISM   HYPERTENSION   PEPTIC ULCER DISEASE   Gastric outlet obstruction   Hypokalemia   Hiatal hernia   Unspecified gastritis and gastroduodenitis without mention of hemorrhage    Time spent:    [facility]  [facility] Pager [phone]. If 7PM-7AM, please contact night-coverage at [email or web address], password [facility] 01/26/2013, 8:31 AM  LOS: 4 days

## 2013-01-26 NOTE — Progress Notes (Signed)
Patient ID: [name] [name], female   DOB: [birth year], 79 y.o.   MRN: [id number]   Subjective: No complaints, denies pain/n/v, tolerating clears  Objective: Vital signs in last 24 hours: Temp:  [97.3 F (36.3 C)-98.3 F (36.8 C)] 97.3 F (36.3 C) (08/29 2102) Pulse Rate:  [77-99] 77 (08/30 0538) Resp:  [22-28] 28 (08/29 2102) BP: (92-128)/(46-81) 116/46 mmHg (08/30 0538) SpO2:  [89 %-99 %] 99 % (08/30 0538) Last BM Date: 01/25/13  Intake/Output from previous day: 08/29 0701 - 08/30 0700 In: 3 [I.V.:3] Out: 280 [Urine:280] Intake/Output this shift:    PE: Abd: soft, nontender General: NAD   Lab Results:   Recent Labs  01/25/13 0455 01/26/13 0511  WBC 10.6* 12.0*  HGB 9.0* 10.3*  HCT 28.0* 32.4*  PLT 255 267   BMET  Recent Labs  01/25/13 0455 01/26/13 0511  NA 142 138  K 5.0 5.4*  CL 116* 109  CO2 16* 16*  GLUCOSE 75 119*  BUN 30* 40*  CREATININE 1.33* 1.59*  CALCIUM 8.6 9.3   PT/INR No results found for this basename: LABPROT, INR,  in the last 72 hours CMP     Component Value Date/Time   NA 138 01/26/2013 0511   K 5.4* 01/26/2013 0511   CL 109 01/26/2013 0511   CO2 16* 01/26/2013 0511   GLUCOSE 119* 01/26/2013 0511   BUN 40* 01/26/2013 0511   CREATININE 1.59* 01/26/2013 0511   CALCIUM 9.3 01/26/2013 0511   PROT 6.1 30-Jan-2013 1850   ALBUMIN 2.7* Jan 30, 2013 1850   AST 21 01-30-2013 1850   ALT 15 2013/01/30 1850   ALKPHOS 128* 2013/01/30 1850   BILITOT 0.4 30-Jan-2013 1850   GFRNONAA 28* 01/26/2013 0511   GFRAA 33* 01/26/2013 0511   Lipase     Component Value Date/Time   LIPASE 23 2013/01/30 1850       Studies/Results: Dg Chest Port 1 View  01/25/2013   *RADIOLOGY REPORT*  Clinical Data: Respiratory distress  PORTABLE CHEST - 1 VIEW  Comparison: 01/09/2013 and upper GI 01/25/2013  Findings:  Cardiomegaly again noted.  Bilateral probable small pleural effusions bilateral basilar atelectasis or infiltrate.  Again noted intrathoracic stomach.  There is  residual barium material in the proximal stomach which is located in the retrocardiac position.  No convincing pulmonary edema.  IMPRESSION: Cardiomegaly.  Bilateral small pleural effusion with bilateral basilar atelectasis or infiltrate.  Residual contrast material noted in proximal stomach. Again noted with diaphragmatic hernia with intrathoracic stomach.   Original Report Authenticated By: Natasha Mead, M.D.   Varney Biles Kayleen Memos W/high Density W/kub  01/25/2013   *RADIOLOGY REPORT*  Clinical Data:  Possible gastric outlet obstruction.  Abdominal pain.  UPPER GI SERIES WITH KUB  Technique:  Routine upper GI series was performed with thin barium.  Fluoroscopy Time: 1 minute 2 seconds  Comparison:  CT abdomen and pelvis 01-30-2013.  Findings:  Single contrast examination was performed.  The study is somewhat limited by the patient's lack of mobility and difficulty consuming contrast material.  Scout film demonstrates a normal bowel gas pattern.  Severe convex right scoliosis and severe right hip degenerative disease are identified.  As seen on CT scan, the patient has an intrathoracic stomach. There is some spontaneous reflux of contrast material into the distal esophagus.  The patient was placed with her right side down but contrast was slow to exit the stomach.  Therefore, a KUB was obtained 1 hour after the study was performed and contrast material is  in small bowel loops on that image.  IMPRESSION:  1.  Limited study is negative for gastric outlet obstruction. 2.  Intrathoracic stomach. 3.  Gastroesophageal reflux. 4.  Severe convex right scoliosis and right hip osteoarthritis.   Original Report Authenticated By: Holley Dexter, M.D.    Anti-infectives: Anti-infectives   None       Assessment/Plan 1. Large hiatal hernia without evidence of volvulus or obstruction   2. Progressive weakness in the recent past and failure to thrive.   3. Community acquired pneumonia.   Rec: UGI shows patent gastric outlet,  tolerating clears.  Will advance to full liquids.  If tolerates this would recommend going home on a fulls or soft diet.  She should f/u with Dr Abbey Chatters in the office.  If she shows signs of nausea or vomiting with full liquids, then she may need a PEG to fix stomach in the abdomen.    LOS: 4 days    Stacey Maura C. 01/26/2013

## 2013-01-26 NOTE — Progress Notes (Signed)
BP 82/40, HR 70, NSR. Metoprolol and Lasix held. Pt lying in bed denies feeling symptomatic. Rectal temp 96.6 (unable to register axillary or oral temp). Room noted to be cold, thermostat adjusted and warm blankets provided. Lenny Pastel, NP notified of above. Will initiate bolus and monitor VS closely.

## 2013-01-26 NOTE — Progress Notes (Signed)
VASCULAR LAB PRELIMINARY  PRELIMINARY  PRELIMINARY  PRELIMINARY  Bilateral lower extremity venous duplex completed.    Preliminary report:  Bilateral:  No evidence of DVT, superficial thrombosis, or Baker's Cyst.   Keerat Denicola, RVT 01/26/2013, 9:36 AM

## 2013-01-26 NOTE — Progress Notes (Signed)
Cross cover [facility] Subjective: Patient seems to be doing fairly well. Just got back from a ventilation perfusion scan which shows matched perfusion defects at the bases secondary to atelectasis; low probabilty for a PE. Patient denies having any abdominal pain, nausea or vomiting. Tolerating clears well.    Objective: Vital signs in last 24 hours: Temp:  [97.3 F (36.3 C)] 97.3 F (36.3 C) (08/29 2102) Pulse Rate:  [77-98] 77 (08/30 0538) Resp:  [22-28] 28 (08/29 2102) BP: (92-128)/(46-81) 116/46 mmHg (08/30 0538) SpO2:  [89 %-99 %] 99 % (08/30 0538) Last BM Date: 01/25/13  Intake/Output from previous day: 08/29 0701 - 08/30 0700 In: 3 [I.V.:3] Out: 280 [Urine:280] Intake/Output this shift:   General appearance: cooperative, appears stated age, fatigued, no distress and pale Resp: clear to auscultation bilaterally Cardio: regular rate and rhythm, S1, S2 normal, no murmur, click, rub or gallop GI: soft, non-tender; bowel sounds normal; no masses,  no organomegaly Extremities: extremities normal, atraumatic, no cyanosis or edema  Lab Results:  Recent Labs  02/23/13 0405 01/25/13 0455 01/26/13 0511  WBC 13.5* 10.6* 12.0*  HGB 10.6* 9.0* 10.3*  HCT 32.0* 28.0* 32.4*  PLT 267 255 267   BMET  Recent Labs  23-Feb-2013 0405 01/25/13 0455 01/26/13 0511  NA 141 142 138  K 4.1 5.0 5.4*  CL 108 116* 109  CO2 20 16* 16*  GLUCOSE 95 75 119*  BUN 25* 30* 40*  CREATININE 1.56* 1.33* 1.59*  CALCIUM 8.6 8.6 9.3   Studies/Results: Nm Pulmonary Perf And Vent  01/26/2013   *RADIOLOGY REPORT*  Clinical Data: Short of breath and hypoxia  NM PULMONARY VENTILATION AND PERFUSION SCAN  Radiopharmaceutical: CURIE MAA TECHNETIUM TO 29M ALBUMIN AGGREGATED  42.9 mCi technetium 66m DTPA inhaled  Comparison: None.  Findings: Patchy perfusion defects are present in the bases bilaterally.  There is partial collapse of the left lower lobe due to cardiac enlargement.  There is also right  lower lobe atelectasis and chest x-ray.  No segmental defects are present aside from the bibasilar atelectasis  Ventilation study shows decreased ventilation in the lung bases left greater than right due to partial collapse.  IMPRESSION: Matched perfusion defects of bases due to atelectasis, left greater than right.  Low probability for pulmonary embolism.   Original Report Authenticated By: Janeece Riggers, M.D.   Dg Chest Port 1 View  01/25/2013   *RADIOLOGY REPORT*  Clinical Data: Respiratory distress  PORTABLE CHEST - 1 VIEW  Comparison: 01/09/2013 and upper GI 01/25/2013  Findings:  Cardiomegaly again noted.  Bilateral probable small pleural effusions bilateral basilar atelectasis or infiltrate.  Again noted intrathoracic stomach.  There is residual barium material in the proximal stomach which is located in the retrocardiac position.  No convincing pulmonary edema.  IMPRESSION: Cardiomegaly.  Bilateral small pleural effusion with bilateral basilar atelectasis or infiltrate.  Residual contrast material noted in proximal stomach. Again noted with diaphragmatic hernia with intrathoracic stomach.   Original Report Authenticated By: Natasha Mead, M.D.   Varney Biles Kayleen Memos W/high Density W/kub  01/25/2013   *RADIOLOGY REPORT*  Clinical Data:  Possible gastric outlet obstruction.  Abdominal pain.  UPPER GI SERIES WITH KUB  Technique:  Routine upper GI series was performed with thin barium.  Fluoroscopy Time: 1 minute 2 seconds  Comparison:  CT abdomen and pelvis 01/02/2013.  Findings:  Single contrast examination was performed.  The study is somewhat limited by the patient's lack of mobility and difficulty consuming contrast material.  Scout film  demonstrates a normal bowel gas pattern.  Severe convex right scoliosis and severe right hip degenerative disease are identified.  As seen on CT scan, the patient has an intrathoracic stomach. There is some spontaneous reflux of contrast material into the distal esophagus.  The patient  was placed with her right side down but contrast was slow to exit the stomach.  Therefore, a KUB was obtained 1 hour after the study was performed and contrast material is in small bowel loops on that image.  IMPRESSION:  1.  Limited study is negative for gastric outlet obstruction. 2.  Intrathoracic stomach. 3.  Gastroesophageal reflux. 4.  Severe convex right scoliosis and right hip osteoarthritis.   Original Report Authenticated By: Holley Dexter, M.D.   Medications: I have reviewed the patient's current medications.  Assessment/Plan: Large diaphragmatic hernia intrathoracic stomach/GERD: Continue present care. Agree with plans as outlined by Dr. Romie Levee. Continue clears for now and advance to full liquids as tolerated. Will sign off for now. Please call if further assistance is needed.    LOS: 4 days   Mahoganie Basher 01/26/2013, 2:34 PM

## 2013-01-27 MED FILL — Medication: Qty: 1 | Status: AC

## 2013-01-28 NOTE — Care Management Note (Addendum)
    Page 1 of 1   01/11/2013     1:27:11 PM   CARE MANAGEMENT NOTE 01/13/2013  Patient:  [name] [name], [name] [name]   Account Number:  [id number]  Date Initiated:  01/23/2013  Documentation initiated by:  [facility]  Subjective/Objective Assessment:   79 Y/O F ADMITTED W/N/V/ABD PAIN.     Action/Plan:   FROM HOME ALONE.ACTIVE [facility] HHRN/PT.   Anticipated DC Date:  01/12/2013   Anticipated DC Plan:  EXPIRED      DC Planning Services  CM consult      Choice offered to / List presented to:             Status of service:  Completed, signed off Medicare Important Message given?   (If response is "NO", the following Medicare IM given date fields will be blank) Date Medicare IM given:   Date Additional Medicare IM given:    Discharge Disposition:  EXPIRED  Per UR Regulation:  Reviewed for med. necessity/level of care/duration of stay  If discussed at Long Length of Stay Meetings, dates discussed:    Comments:  01/03/2013 Sherrine Salberg RN,BSN NCM WEEKEND [phone] EXPIRED.  01/23/13 Maricia Scotti RN,BSN NCM [phone] CONFIRMED ACTIVE [facility] HHRN/PT.WOULD RECOMMEND PT/OT CONS WHEN MD FEEL APPROPRIATE.GI/SX FOLLOWING,?GASTRIC OUTLET OBSTRUCTION.NGT-LIS.

## 2013-01-28 NOTE — Progress Notes (Addendum)
Chaplain returned page at 4:43am. Arrived at 5:12am. Reported that patient expired after coding. Family present, having been apparently notified of code.  Family gone when Chaplain arrived.  Rema Jasmine, Chaplain  Pager: [phone]

## 2013-01-28 NOTE — ED Notes (Signed)
[facility]  Department of Emergency Medicine   Code Blue CONSULT NOTE  Chief Complaint: Cardiac arrest/unresponsive   Level V Caveat: Unresponsive  History of present illness: I was contacted by the hospital for a CODE BLUE cardiac arrest upstairs and presented to the patient's bedside.  Patient's nurse reports patient noted to have bradycardia, then had asystole.  Covering hospitalist NP reports slightly elevated K earlier today, renal insufficiency.  Admitted for gastric outlet obstruction.  ROS: Unable to obtain, Level V caveat  Scheduled Meds: . enoxaparin (LOVENOX) injection  30 mg Subcutaneous Q24H  . furosemide  40 mg Intravenous Q12H  . metoprolol  10 mg Intravenous Q6H  . pantoprazole (PROTONIX) IV  40 mg Intravenous QHS  . sodium chloride  3 mL Intravenous Q12H   Continuous Infusions:  PRN Meds:.ondansetron (ZOFRAN) IV Past Medical History  Diagnosis Date  . Hypertension   . Heart murmur   . Gout   . Hypothyroidism   . Afib   . Muscular degeneration   . Breast cancer, left breast   . Arthritis   . Peptic ulcer disease   . Anemia   . Hyperlipidemia   . Cancer     Left breast  . Renal insufficiency 10/12/2011  . Hx of echocardiogram     a. Echo 2/14:  EF 60-65%, mild AI, MAC, mild MR, mod to severe LAE, mild RAE, PASP 34   Past Surgical History  Procedure Laterality Date  . Cesarean section    . Appendectomy    . Upper gastrointestinal endoscopy    . Breast surgery      left  . Breast lumpectomy  october 2011    left   . Esophagogastroduodenoscopy N/A 01/17/2013    Procedure: ESOPHAGOGASTRODUODENOSCOPY (EGD);  Surgeon: Louis Meckel, MD;  Location: [facility];  Service: Endoscopy;  Laterality: N/A;   History   Social History  . Marital Status: Widowed    Spouse Name: N/A    Number of Children: N/A  . Years of Education: N/A   Occupational History  . retired [employer]    Social History Main Topics  . Smoking status: Never Smoker    . Smokeless tobacco: Never Used  . Alcohol Use: No  . Drug Use: No  . Sexual Activity: Not on file   Other Topics Concern  . Not on file   Social History Narrative  . No narrative on file   No Known Allergies  Last set of Vital Signs (not current) Filed Vitals:   01/25/2013 0203  BP:   Pulse:   Temp: 96.6 F (35.9 C)  Resp:       Physical Exam  Gen: unresponsive Cardiovascular: pulseless  Resp: agonal breathing. Breath sounds equal bilaterally with bagging  Abd: distended  Neuro: GCS 3, unresponsive to pain  HEENT: No blood in posterior pharynx, but copious amounts of pink thick fluid.  Gag reflex absent  Neck: No crepitus  Musculoskeletal: No deformity  Skin: warm  Procedures  INTUBATION Performed by: Olivia Mackie Required items: required blood products, implants, devices, and special equipment available Patient identity confirmed: provided demographic data and hospital-assigned identification number Time out: Immediately prior to procedure a "time out" was called to verify the correct patient, procedure, equipment, support staff and site/side marked as required. Indications: agonal respirations Intubation method: direct laryngoscopy Preoxygenation: BVM Sedatives: none Paralytic: non Tube Size: 7.0 cuffed Post-procedure assessment: chest rise and ETCO2 monitor Breath sounds: equal and absent over the epigastrium Tube secured  by Respiratory Therapy Large amount of pink fluid in ETT, suctioned by RT  CRITICAL CARE Performed by: Olivia Mackie Total critical care time: 40 min Critical care time was exclusive of separately billable procedures and treating other patients. Critical care was necessary to treat or prevent imminent or life-threatening deterioration. Critical care was time spent personally by me on the following activities: development of treatment plan with patient and/or surrogate as well as nursing, discussions with consultants, evaluation of  patient's response to treatment, examination of patient, obtaining history from patient or surrogate, ordering and performing treatments and interventions, ordering and review of laboratory studies, ordering and review of radiographic studies, pulse oximetry and re-evaluation of patient's condition.  Cardiopulmonary Resuscitation (CPR) Procedure Note  Directed/Performed by: Olivia Mackie I personally directed ancillary staff and/or performed CPR in an effort to regain return of spontaneous circulation and to maintain cardiac, neuro and systemic perfusion.    Medical Decision making  79 yo female with cardiopulmonary arrest, possible hyperkalemia/renal failure vs MI, PE, aspiration  Assessment and Plan  After 20 minutes of resuscitation, there was no return of pulses.  Pt had three rounds of atropine, four rounds of epinephrine, an amp of bicarb and calcium given.  With maximal intervention, there was no recovery from the arrest.  Time of death 59.    Olivia Mackie, MD 01-29-2013 [phone]

## 2013-01-28 DEATH — deceased

## 2013-02-05 ENCOUNTER — Encounter (INDEPENDENT_AMBULATORY_CARE_PROVIDER_SITE_OTHER): Payer: Medicare Other | Admitting: General Surgery

## 2013-02-11 ENCOUNTER — Ambulatory Visit: Payer: Medicare Other

## 2013-02-27 NOTE — Discharge Summary (Signed)
Death Summary  [name] [name] GNF:[id number] DOB: [birth year] DOA: 02-04-13  PCP: [name], MD   Admit date: 04-Feb-2013 Date of Death:February 09, 2013  Final Diagnoses:    Cardiopulmonary arrest   HYPOTHYROIDISM   HYPERTENSION   PEPTIC ULCER DISEASE   Gastric outlet obstruction   Hyperkalemia   LArge Hiatal hernia   Unspecified gastritis and gastroduodenitis without mention of hemorrhage   Acute diastolic CHF (congestive heart failure)   History of present illness:  [name] [name] is a 79 y.o. female with multiple medical problems, came infor Nausea, vomiting and epigastric pain since yesterday. Vomiting, is non bloody, non bilious, associated with nausea. Epigastric pain, crampy in nature and tender to palpation. No fever or chills no diarrhea. On arrival to ED, she underwent CT of the abdomen showed gastric outlet obstruction. She was referred to medical service for admission.    Hospital Course:  . Functional Gastric obstruction from large Thoracic Hiatal hernia  -Improved symptomatically with NG decompression, NG out 8/28 after EGD  -EGD 8/28 with Retained gastric contents and large hiatal hernia widely patent pylorus/distal stomach  -Upper GI series with large intrathoracic hiatal hernia likely causing intermittent functional obstruction  -followed by [facility] and GI  She also developed Mild resp distress/diastolic CHF  -diuresed with IV lasix and improved - Dopplers and VQ scan negative for  VTE  And was treated with Kayexalate for Hyperkalemia  But overnight on Feb 10, 2023 suddenly coded, had sudden cardiopulmonary arrest and Despite Code Blue run by MDs, expired      Time:  Signed:  Nai Borromeo  [facility] 02/11/2013, 3:00 PM

## 2013-02-27 DEATH — deceased

## 2014-02-03 IMAGING — CT CT ABD-PELV W/O CM
1 of 2 series · 15 of 32 positions shown, 19 images · non-contrast
Comparison: None.

CLINICAL DATA: Nausea, vomiting, pneumonia, weakness.

CT ABDOMEN AND PELVIS WITHOUT CONTRAST
TECHNIQUE: Multidetector CT imaging of the abdomen and pelvis was
performed following the standard protocol without intravenous
contrast.

[Series 2: abd/pel w/o · axial · non-contrast · 0.74mm/px · z∈[+1277,+1677]mm · 15 of 88 slices shown, 19 images]
[im 4/88  soft-tissue]
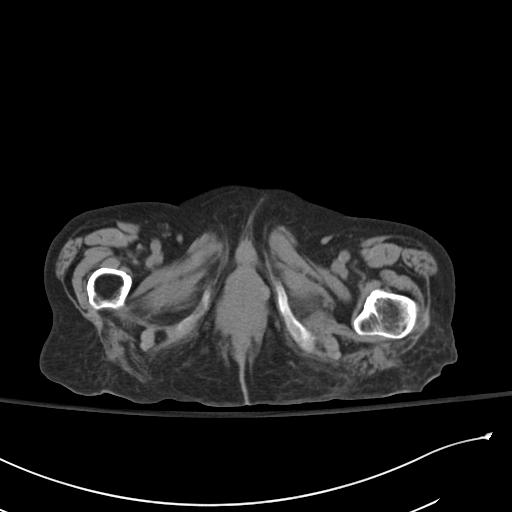
[im 4/88  bone]
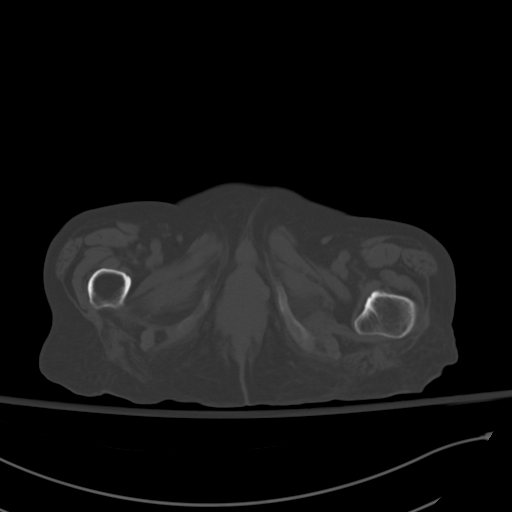
[im 12/88  soft-tissue]
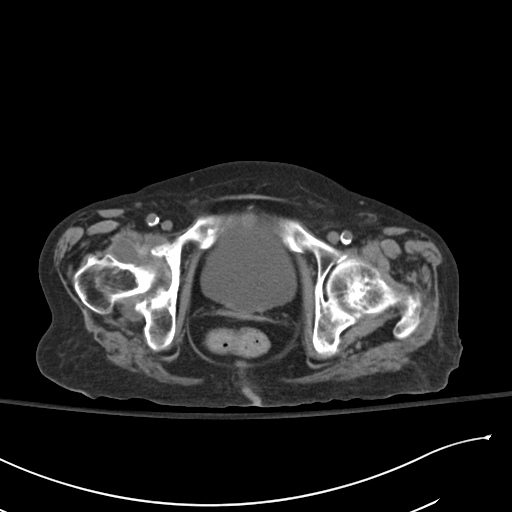
[im 20/88  soft-tissue]
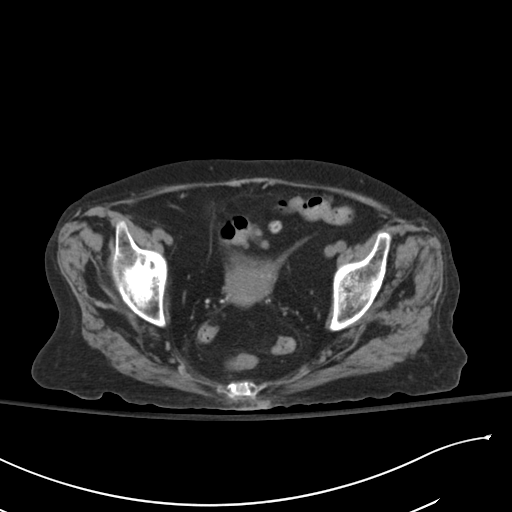
[im 24/88  soft-tissue]
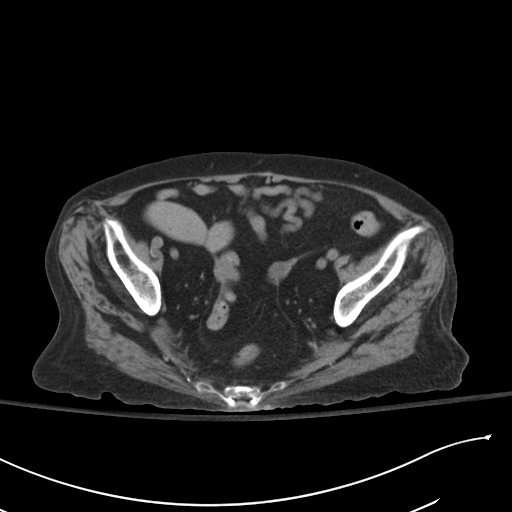
[im 32/88  soft-tissue]
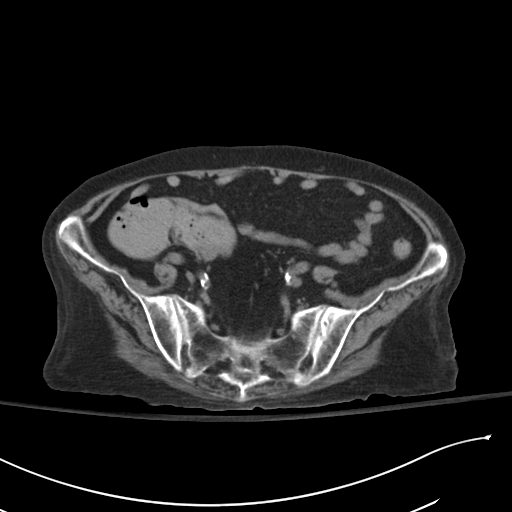
[im 36/88  soft-tissue]
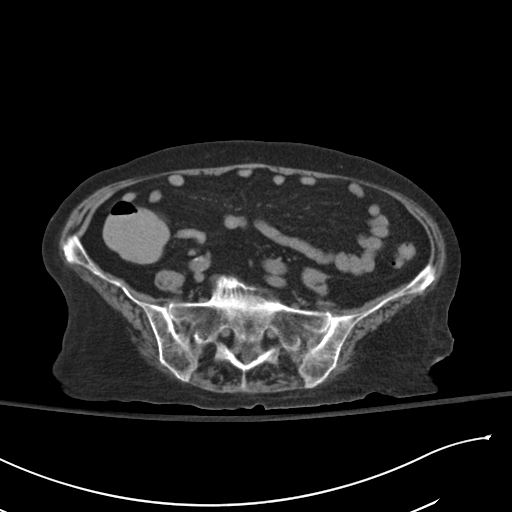
[im 44/88  soft-tissue]
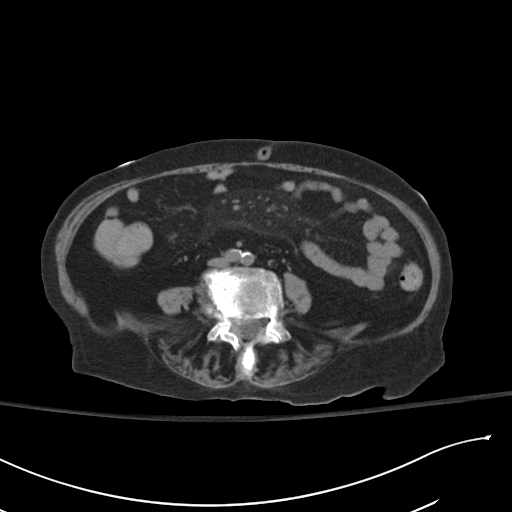
[im 52/88  soft-tissue]
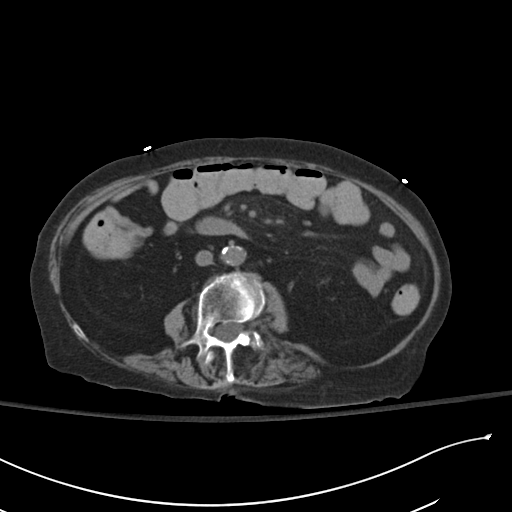
[im 56/88  soft-tissue]
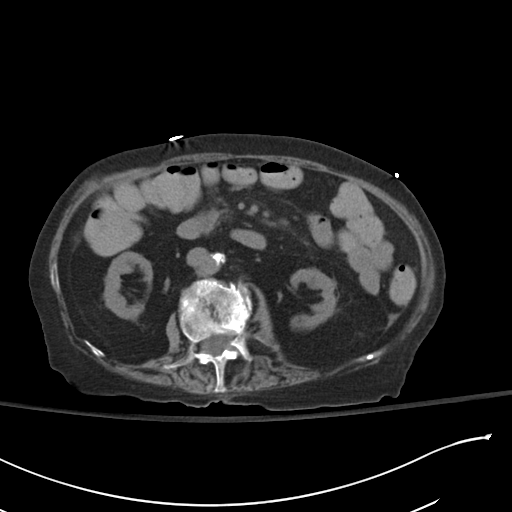
[im 56/88  bone]
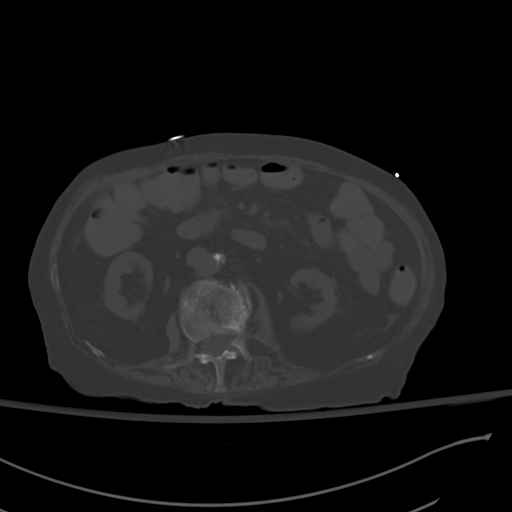
[im 64/88  soft-tissue]
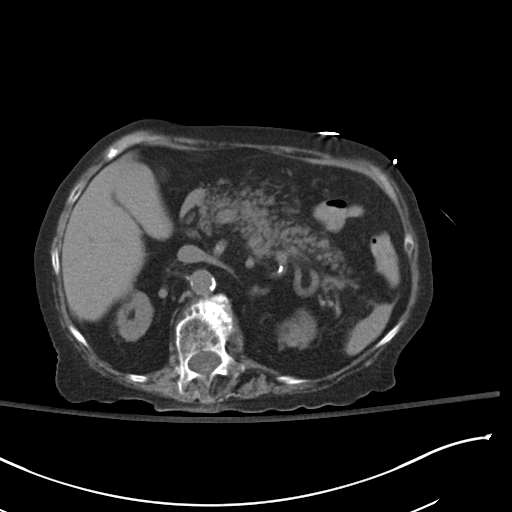
[im 68/88  soft-tissue]
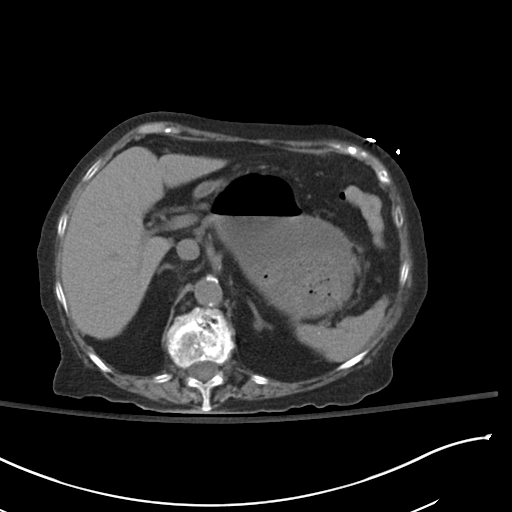
[im 72/88  lung]
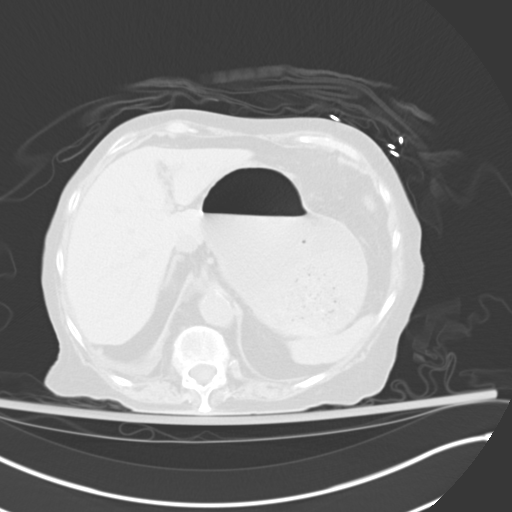
[im 76/88  soft-tissue]
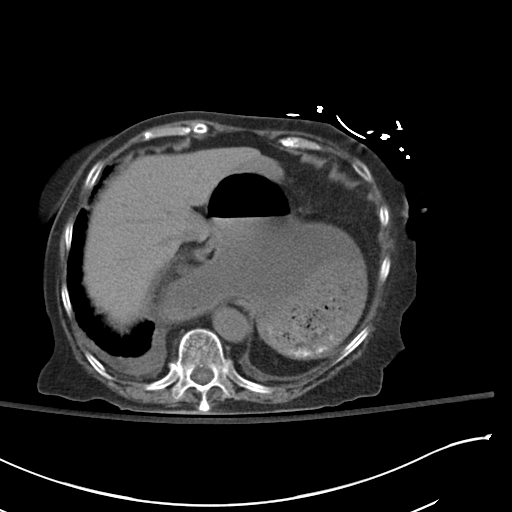
[im 76/88  lung]
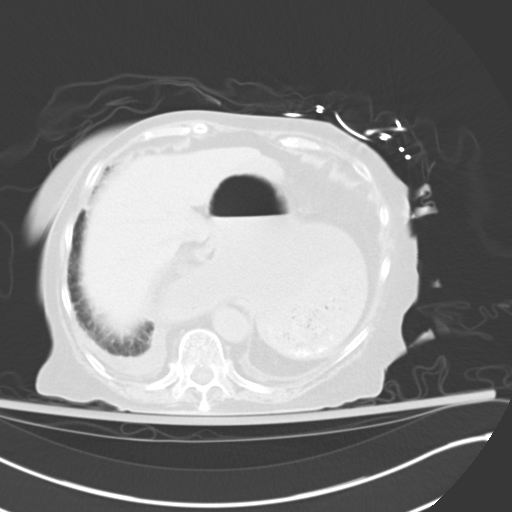
[im 80/88  lung]
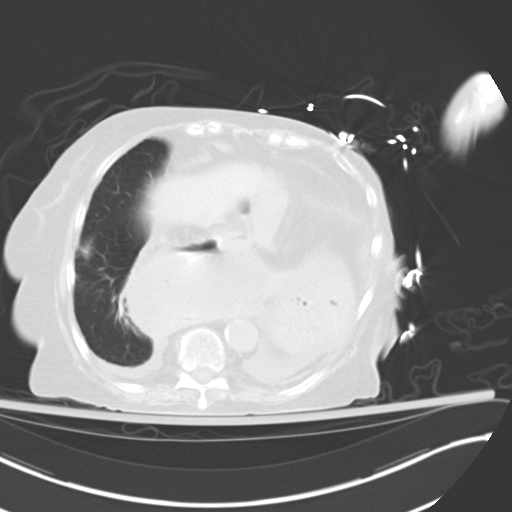
[im 84/88  soft-tissue]
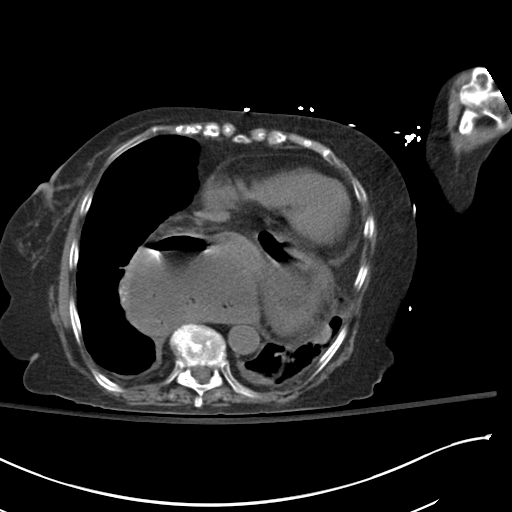
[im 84/88  lung]
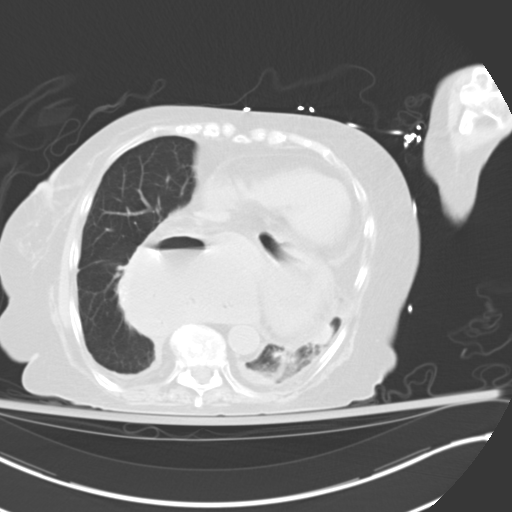

[15 of 32 positions shown; findings below may reference images not displayed]

FINDINGS: Small bilateral pleural effusions with some adjacent consolidation
/ atelectasis in the visualized left lung base.  A large hiatal
hernia.  The stomach is distended. Duodenum and distal bowel and
colon decompressed.  A few scattered sigmoid and descending colon
diverticula.  Unremarkable uninfused evaluation of liver, spleen,
adrenal glands.  Marked bilateral renal parenchymal atrophy without
focal lesion.  Mild pancreatic parenchymal atrophy without focal
lesion.
Patchy aortoiliac arterial calcifications without aneurysm.  Uterus
and adnexal regions unremarkable.  Urinary bladder physiologically
distended.  No ascites.  No free air.  Left pelvic phlebolith.
Spondylitic changes throughout the lumbar spine.
IMPRESSION: 1.  Large hiatal hernia with marked gastric distention and a
decompressed duodenum suggesting possible gastric outlet
obstruction.
2.  Sigmoid and descending colon diverticulosis.
3.  Small bilateral pleural effusions.
